# Patient Record
Sex: Male | Born: 1985 | Race: White | Hispanic: No | Marital: Single | Smoking: Never smoker
Health system: Southern US, Community
[De-identification: ages and names within clinical notes are randomized; demographics above are authoritative.]

## PROBLEM LIST (undated history)

## (undated) DIAGNOSIS — E559 Vitamin D deficiency, unspecified: Secondary | ICD-10-CM

## (undated) DIAGNOSIS — G8929 Other chronic pain: Secondary | ICD-10-CM

## (undated) DIAGNOSIS — K219 Gastro-esophageal reflux disease without esophagitis: Secondary | ICD-10-CM

## (undated) DIAGNOSIS — M549 Dorsalgia, unspecified: Secondary | ICD-10-CM

## (undated) DIAGNOSIS — G5600 Carpal tunnel syndrome, unspecified upper limb: Secondary | ICD-10-CM

## (undated) DIAGNOSIS — G4730 Sleep apnea, unspecified: Secondary | ICD-10-CM

## (undated) HISTORY — PX: OTHER SURGICAL HISTORY: SHX169

## (undated) HISTORY — DX: Carpal tunnel syndrome, unspecified upper limb: G56.00

## (undated) HISTORY — DX: Gastro-esophageal reflux disease without esophagitis: K21.9

## (undated) HISTORY — DX: Vitamin D deficiency, unspecified: E55.9

## (undated) HISTORY — DX: Sleep apnea, unspecified: G47.30

---

## 2003-08-11 ENCOUNTER — Encounter: Payer: Self-pay | Admitting: *Deleted

## 2003-08-11 ENCOUNTER — Ambulatory Visit (HOSPITAL_COMMUNITY): Admission: RE | Admit: 2003-08-11 | Discharge: 2003-08-11 | Payer: Self-pay | Admitting: *Deleted

## 2007-08-07 ENCOUNTER — Emergency Department (HOSPITAL_COMMUNITY): Admission: EM | Admit: 2007-08-07 | Discharge: 2007-08-08 | Payer: Self-pay | Admitting: Emergency Medicine

## 2009-02-06 ENCOUNTER — Emergency Department (HOSPITAL_COMMUNITY): Admission: EM | Admit: 2009-02-06 | Discharge: 2009-02-06 | Payer: Self-pay | Admitting: Emergency Medicine

## 2009-09-25 ENCOUNTER — Emergency Department (HOSPITAL_COMMUNITY): Admission: EM | Admit: 2009-09-25 | Discharge: 2009-09-25 | Payer: Self-pay | Admitting: Emergency Medicine

## 2009-12-04 ENCOUNTER — Emergency Department (HOSPITAL_COMMUNITY): Admission: EM | Admit: 2009-12-04 | Discharge: 2009-12-04 | Payer: Self-pay | Admitting: Emergency Medicine

## 2013-07-15 ENCOUNTER — Emergency Department (HOSPITAL_COMMUNITY)
Admission: EM | Admit: 2013-07-15 | Discharge: 2013-07-15 | Disposition: A | Discharge status: Home / Self Care | Payer: Self-pay | Attending: Emergency Medicine | Admitting: Emergency Medicine

## 2013-07-15 ENCOUNTER — Encounter (HOSPITAL_COMMUNITY): Payer: Self-pay | Admitting: Emergency Medicine

## 2013-07-15 ENCOUNTER — Emergency Department (HOSPITAL_COMMUNITY): Payer: Self-pay

## 2013-07-15 DIAGNOSIS — K089 Disorder of teeth and supporting structures, unspecified: Secondary | ICD-10-CM | POA: Insufficient documentation

## 2013-07-15 DIAGNOSIS — R079 Chest pain, unspecified: Secondary | ICD-10-CM

## 2013-07-15 DIAGNOSIS — R0789 Other chest pain: Secondary | ICD-10-CM | POA: Insufficient documentation

## 2013-07-15 DIAGNOSIS — Z792 Long term (current) use of antibiotics: Secondary | ICD-10-CM | POA: Insufficient documentation

## 2013-07-15 DIAGNOSIS — K0889 Other specified disorders of teeth and supporting structures: Secondary | ICD-10-CM

## 2013-07-15 LAB — POCT I-STAT TROPONIN I: Troponin i, poc: 0.01 ng/mL (ref 0.00–0.08)

## 2013-07-15 LAB — CBC
HCT: 45.2 % (ref 39.0–52.0)
Hemoglobin: 16.4 g/dL (ref 13.0–17.0)
MCHC: 36.3 g/dL — ABNORMAL HIGH (ref 30.0–36.0)
MCV: 86.9 fL (ref 78.0–100.0)
RDW: 12.9 % (ref 11.5–15.5)

## 2013-07-15 LAB — BASIC METABOLIC PANEL
GFR calc non Af Amer: >90 mL/min (ref >90)
Potassium: 3.7 mEq/L (ref 3.5–5.1)
Sodium: 137 mEq/L (ref 135–145)

## 2013-07-15 LAB — PRO B NATRIURETIC PEPTIDE: Pro B Natriuretic peptide (BNP): 6.9 pg/mL (ref 0–125)

## 2013-07-15 MED ORDER — IBUPROFEN 600 MG PO TABS: 600.0000 mg | ORAL_TABLET | Freq: Four times a day (QID) | ORAL | Status: DC | PRN

## 2013-07-15 MED ORDER — PENICILLIN V POTASSIUM 500 MG PO TABS: 500.0000 mg | ORAL_TABLET | Freq: Four times a day (QID) | ORAL | Status: AC

## 2013-07-15 NOTE — ED Provider Notes (Signed)
CSN: [id number]     Arrival date & time 07/15/13  8469 History   First MD Initiated Contact with Patient 07/15/13 0900     Chief Complaint  Patient presents with  . Chest Pain   (Consider location/radiation/quality/duration/timing/severity/associated sxs/prior Treatment) Patient is a 27 y.o. male presenting with chest pain. The history is provided by the patient. No language interpreter was used.  Chest Pain Pain location:  Substernal area Pain quality: aching and pressure   Pain radiates to:  Does not radiate Pain radiates to the back: no   Pain severity:  Moderate Onset quality:  Sudden Duration:  2 hours Timing:  Constant Progression:  Resolved Chronicity:  New Context: at rest   Context comment:  After onset of dental pain Relieved by:  Rest Worsened by:  Nothing tried Ineffective treatments:  None tried Associated symptoms: no abdominal pain, no back pain, no cough, no dizziness, no dysphagia, no fatigue, no fever, no headache, no nausea, no near-syncope, no numbness, no shortness of breath, not vomiting and no weakness   Associated symptoms comment:  Brief episode of dizziness, dental pain  Risk factors: male sex and obesity   Risk factors: no smoking     History reviewed. No pertinent past medical history. History reviewed. No pertinent past surgical history. History reviewed. No pertinent family history. History  Substance Use Topics  . Smoking status: Never Smoker   . Smokeless tobacco: Never Used  . Alcohol Use: 1.2 oz/week    2 Shots of liquor per week    Review of Systems  Constitutional: Negative for fever, activity change, appetite change and fatigue.  HENT: Positive for dental problem. Negative for congestion, facial swelling, rhinorrhea and trouble swallowing.   Eyes: Negative for photophobia and pain.  Respiratory: Negative for cough, chest tightness and shortness of breath.   Cardiovascular: Positive for chest pain. Negative for leg swelling and  near-syncope.  Gastrointestinal: Negative for nausea, vomiting, abdominal pain, diarrhea and constipation.  Endocrine: Negative for polydipsia and polyuria.  Genitourinary: Negative for dysuria, urgency, decreased urine volume and difficulty urinating.  Musculoskeletal: Negative for back pain and gait problem.  Skin: Negative for color change, rash and wound.  Allergic/Immunologic: Negative for immunocompromised state.  Neurological: Negative for dizziness, facial asymmetry, speech difficulty, weakness, numbness and headaches.  Psychiatric/Behavioral: Negative for confusion, decreased concentration and agitation.    Allergies  Review of patient's allergies indicates no known allergies.  Home Medications   Current Outpatient Rx  Name  Route  Sig  Dispense  Refill  . acetaminophen (TYLENOL) 500 MG tablet   Oral   Take 1,000 mg by mouth every 6 (six) hours as needed for pain.         Marland Kitchen ibuprofen (ADVIL,MOTRIN) 600 MG tablet   Oral   Take 1 tablet (600 mg total) by mouth every 6 (six) hours as needed for pain.   30 tablet   0   . penicillin v potassium (VEETID) 500 MG tablet   Oral   Take 1 tablet (500 mg total) by mouth 4 (four) times daily.   28 tablet   0    BP 101/72  Pulse 96  Temp(Src) 97.7 F (36.5 C) (Oral)  Resp 15  SpO2 99% Physical Exam  Constitutional: He is oriented to person, place, and time. He appears well-developed and well-nourished. No distress.  HENT:  Head: Normocephalic and atraumatic.  Mouth/Throat: Abnormal dentition. Dental caries present. No oropharyngeal exudate.  Very poor dentition w/ multiple eroded teeth  Eyes: Pupils are equal, round, and reactive to light.  Neck: Normal range of motion. Neck supple.  Cardiovascular: Normal rate, regular rhythm and normal heart sounds.  Exam reveals no gallop and no friction rub.   No murmur heard. Pulmonary/Chest: Effort normal and breath sounds normal. No respiratory distress. He has no wheezes. He  has no rales.  Abdominal: Soft. Bowel sounds are normal. He exhibits no distension and no mass. There is no tenderness. There is no rebound and no guarding.  Musculoskeletal: Normal range of motion. He exhibits no edema and no tenderness.  Neurological: He is alert and oriented to person, place, and time.  Skin: Skin is warm and dry.  Psychiatric: He has a normal mood and affect.    ED Course  Procedures (including critical care time) Labs Review Labs Reviewed  BASIC METABOLIC PANEL - Abnormal; Notable for the following:    Glucose, Bld 112 (*)    All other components within normal limits  CBC - Abnormal; Notable for the following:    MCHC 36.3 (*)    All other components within normal limits  PRO B NATRIURETIC PEPTIDE  POCT I-STAT TROPONIN I   Imaging Review Dg Chest Port 1 View  07/15/2013   *RADIOLOGY REPORT*  Clinical Data: Chest pain  PORTABLE CHEST - 1 VIEW  Comparison: December 04, 2009.  Findings: Cardiomediastinal silhouette appears normal.  No acute pulmonary disease is noted.  Bony thorax is intact.  IMPRESSION: No acute cardiopulmonary abnormality seen.   Original Report Authenticated By: Lupita Raider.,  M.D.    Date: 07/15/2013  Rate: 96  Rhythm: normal sinus rhythm  QRS Axis: normal  Intervals: normal  ST/T Wave abnormalities: normal  Conduction Disutrbances: none  Narrative Interpretation: unremarkable      MDM   1. Pain, dental   2. Chest pain    Pt is a 27 y.o. male with Pmhx as above who presents with onset of dental pain about 7am, then 2 hrs of central chest tightness form around 7:30-9:30 while at rest.  He reports then having a brief dizzy episode where he felt SOB.  Currently pt reports feeling "weak" in his chest.  No fever, chills, cough, ab pain.  Cardiopulm exam benign.  Pt has very poor dentition.  EKG nml, CP triage w/u ordered including CXR which was unremarkable, neg trop.  PERC negative, low risk for MACE by HEART score.  I believe pt safe  for outpt f/u.  Will start pen vk for likely dental infection and pt will f/u with local dentist.  Return precautions given for new or worsening symptoms   1. Pain, dental   2. Chest pain         Shanna Cisco, MD 07/15/13 [phone]

## 2013-07-15 NOTE — ED Notes (Signed)
Patient said he was at work and started having a tooth ache, then it turned into chest pain.  Patient said he took some aspirin from a med kit at work.  Patient still had chest pain and when his relief came to work he came to [facility] ED to be evaluated.

## 2013-07-15 NOTE — ED Notes (Signed)
Pt brought to room, pt undressed, in gown, on monitor; EKG perfromed

## 2013-07-15 NOTE — ED Notes (Signed)
Patient is alert and orientedx4.  Patient was explained discharge instructions and they understood them with no questions.   

## 2013-07-15 NOTE — ED Notes (Signed)
Patient is resting comfortably. 

## 2013-09-17 ENCOUNTER — Encounter (HOSPITAL_COMMUNITY): Payer: Self-pay | Admitting: Emergency Medicine

## 2013-09-17 ENCOUNTER — Emergency Department (HOSPITAL_COMMUNITY)
Admission: EM | Admit: 2013-09-17 | Discharge: 2013-09-17 | Disposition: A | Discharge status: Home / Self Care | Payer: Self-pay | Attending: Emergency Medicine | Admitting: Emergency Medicine

## 2013-09-17 DIAGNOSIS — Z7982 Long term (current) use of aspirin: Secondary | ICD-10-CM | POA: Insufficient documentation

## 2013-09-17 DIAGNOSIS — K0889 Other specified disorders of teeth and supporting structures: Secondary | ICD-10-CM

## 2013-09-17 DIAGNOSIS — K089 Disorder of teeth and supporting structures, unspecified: Secondary | ICD-10-CM | POA: Insufficient documentation

## 2013-09-17 MED ORDER — HYDROCODONE-ACETAMINOPHEN 5-325 MG PO TABS: 1.0000 | ORAL_TABLET | Freq: Four times a day (QID) | ORAL | Status: DC | PRN

## 2013-09-17 MED ORDER — PENICILLIN V POTASSIUM 500 MG PO TABS: 500.0000 mg | ORAL_TABLET | Freq: Four times a day (QID) | ORAL | Status: AC

## 2013-09-17 NOTE — ED Provider Notes (Signed)
CSN: [id number]     Arrival date & time 09/17/13  1509 History  This chart was scribed for non-physician practitioner, Santiago Glad, PA-C working with [name], MD by Greggory Stallion, ED scribe. This patient was seen in room WTR8/WTR8 and the patient's care was started at 3:58 PM.   Chief Complaint  Patient presents with  . Dental Pain   Patient is a 27 y.o. male presenting with tooth pain. The history is provided by the patient. No language interpreter was used.  Dental Pain Associated symptoms: no difficulty swallowing, no facial swelling, no fever, no gum swelling, no neck pain, no neck swelling and no trismus    HPI Comments: Grant Allen is a 27 y.o. male who presents to the Emergency Department complaining of gradual onset, gradually worsening right lower dental pain with associated swelling that started last night around 8:30 PM. He has taken aspirin and ibuprofen with no relief. Pt states cold liquids help relief some pain. He states he just got a new dentist but has not been yet. Pt denies fever, chills.   No past medical history on file. No past surgical history on file. No family history on file. History  Substance Use Topics  . Smoking status: Never Smoker   . Smokeless tobacco: Never Used  . Alcohol Use: 1.2 oz/week    2 Shots of liquor per week    Review of Systems  Constitutional: Negative for fever and chills.  HENT: Positive for dental problem. Negative for facial swelling.   Musculoskeletal: Negative for neck pain.  All other systems reviewed and are negative.    Allergies  Review of patient's allergies indicates no known allergies.  Home Medications   Current Outpatient Rx  Name  Route  Sig  Dispense  Refill  . acetaminophen (TYLENOL) 500 MG tablet   Oral   Take 1,000 mg by mouth every 6 (six) hours as needed for pain.         Marland Kitchen aspirin 325 MG tablet   Oral   Take 325 mg by mouth daily.         Marland Kitchen ibuprofen (ADVIL,MOTRIN) 600 MG tablet    Oral   Take 1 tablet (600 mg total) by mouth every 6 (six) hours as needed for pain.   30 tablet   0   . penicillin v potassium (VEETID) 500 MG tablet   Oral   Take 500 mg by mouth once.          BP 153/73  Pulse 92  Temp(Src) 97.4 F (36.3 C) (Oral)  Resp 19  SpO2 100%  Physical Exam  Nursing note and vitals reviewed. Constitutional: He is oriented to person, place, and time. He appears well-developed and well-nourished. No distress.  HENT:  Head: Normocephalic and atraumatic.  Right Ear: Tympanic membrane and ear canal normal.  Left Ear: Tympanic membrane and ear canal normal.  Mouth/Throat: Uvula is midline, oropharynx is clear and moist and mucous membranes are normal. No trismus in the jaw. Abnormal dentition. No dental abscesses or uvula swelling. No oropharyngeal exudate, posterior oropharyngeal edema, posterior oropharyngeal erythema or tonsillar abscesses.  Poor dental hygiene. Pt able to open and close mouth with out difficulty. Airway intact. Uvula midline. Mild gingival swelling with tenderness over affected area, but no fluctuance. No swelling or tenderness of submental and submandibular regions.  No tongue elevation.  No sublingual tenderness to palpation.   Eyes: Conjunctivae and EOM are normal. Pupils are equal, round, and reactive to light.  Neck: Normal range of motion and full passive range of motion without pain. Neck supple.  Cardiovascular: Normal rate, regular rhythm and normal heart sounds.   Pulmonary/Chest: Effort normal and breath sounds normal.  Musculoskeletal: Normal range of motion.  Lymphadenopathy:       Head (right side): No submental, no submandibular, no tonsillar, no preauricular and no posterior auricular adenopathy present.       Head (left side): No submental, no submandibular, no tonsillar, no preauricular and no posterior auricular adenopathy present.    He has no cervical adenopathy.  Neurological: He is alert and oriented to person,  place, and time.  Skin: Skin is warm and dry. No rash noted. He is not diaphoretic.  Psychiatric: He has a normal mood and affect. His behavior is normal.    ED Course  Procedures (including critical care time)  DIAGNOSTIC STUDIES: Oxygen Saturation is 100% on RA, normal by my interpretation.    COORDINATION OF CARE: 4:02 PM-Discussed treatment plan which includes an antibiotic and pain medication with pt at bedside and pt agreed to plan. Advised pt to follow up with a dentist.   Labs Review Labs Reviewed - No data to display Imaging Review No results found.  EKG Interpretation   None       MDM  No diagnosis found. Patient with toothache.  No gross abscess.  Exam unconcerning for Ludwig's angina or spread of infection.  Will treat with penicillin and pain medicine.  Urged patient to follow-up with dentist.    I personally performed the services described in this documentation, which was scribed in my presence. The recorded information has been reviewed and is accurate.   Santiago Glad, PA-C 09/17/13 [phone]

## 2013-09-17 NOTE — ED Notes (Signed)
Pt reports acute pain and swelling in lower r/side of mouth

## 2013-09-22 NOTE — ED Provider Notes (Signed)
Medical screening examination/treatment/procedure(s) were performed by non-physician practitioner and as supervising physician I was immediately available for consultation/collaboration.  EKG Interpretation   None        [name], MD 09/22/13 [phone]

## 2013-12-08 ENCOUNTER — Encounter (HOSPITAL_COMMUNITY): Payer: Self-pay | Admitting: Emergency Medicine

## 2013-12-08 ENCOUNTER — Emergency Department (HOSPITAL_COMMUNITY)
Admission: EM | Admit: 2013-12-08 | Discharge: 2013-12-08 | Disposition: A | Discharge status: Home / Self Care | Payer: Self-pay | Attending: Emergency Medicine | Admitting: Emergency Medicine

## 2013-12-08 DIAGNOSIS — R5383 Other fatigue: Secondary | ICD-10-CM

## 2013-12-08 DIAGNOSIS — IMO0002 Reserved for concepts with insufficient information to code with codable children: Secondary | ICD-10-CM | POA: Insufficient documentation

## 2013-12-08 DIAGNOSIS — M5416 Radiculopathy, lumbar region: Secondary | ICD-10-CM

## 2013-12-08 DIAGNOSIS — Z7982 Long term (current) use of aspirin: Secondary | ICD-10-CM | POA: Insufficient documentation

## 2013-12-08 DIAGNOSIS — R5381 Other malaise: Secondary | ICD-10-CM | POA: Insufficient documentation

## 2013-12-08 MED ORDER — HYDROCODONE-ACETAMINOPHEN 5-325 MG PO TABS: ORAL_TABLET | ORAL | Status: DC

## 2013-12-08 MED ORDER — PREDNISONE 20 MG PO TABS: ORAL_TABLET | ORAL | Status: DC

## 2013-12-08 MED ORDER — NAPROXEN 500 MG PO TABS: 500.0000 mg | ORAL_TABLET | Freq: Two times a day (BID) | ORAL | Status: DC

## 2013-12-08 MED ORDER — METHOCARBAMOL 500 MG PO TABS: 1000.0000 mg | ORAL_TABLET | Freq: Four times a day (QID) | ORAL | Status: DC

## 2013-12-08 NOTE — ED Notes (Signed)
Pt a+ox4, presents from home with c/o BLE pain, L>R, 7/10.  NKI, however pt sts "i think i must have pulled something".  Pt reports pain starts in hips and radiates into knees.  Pt also reports numbness to L lateral calf, intermittently.  Pt ambulating with steady gait, however using a cane, sts "i bought this the other day and it makes me feel better".  MAEI.  +csm.  No neuro deficits noted.  Skin pwd.  Pt is well appearing and denies other complaints.

## 2013-12-08 NOTE — ED Provider Notes (Signed)
CSN: [id number]     Arrival date & time 12/08/13  1527 History  This chart was scribed for non-physician practitioner, Rhea Bleacher, PA-C working with Richardean Canal, MD by Greggory Stallion, ED scribe. This patient was seen in room WTR7/WTR7 and the patient's care was started at 5:57 PM.    Chief Complaint  Patient presents with  . Leg Pain   The history is provided by the patient. No language interpreter was used.   HPI Comments: Grant Allen is a 28 y.o. male who presents to the Emergency Department complaining of gradual onset, constant left leg pain that started one month ago. Denies injury. He thinks he pulled something in his back before the pain started. Pt has some weakness in his left leg and states it feels heavier. He states he also has intermittent numbness to his left calf and a constant spasm that feels like a [employer]. Pt has been using a cane with some relief. He states sitting in certain positions make the pain better. Bearing weight for too long worsens the pain. Denies fever, unexpected weight loss, constipation, difficulty urinating, bowel or bladder incontinence. He states he has some history of back problems due to work. Denies history of IV drug use.   History reviewed. No pertinent past medical history. History reviewed. No pertinent past surgical history. Family History  Problem Relation Age of Onset  . Diabetes Mother   . Cancer Mother   . Cancer Brother    History  Substance Use Topics  . Smoking status: Never Smoker   . Smokeless tobacco: Never Used  . Alcohol Use: 1.2 oz/week    2 Shots of liquor per week    Review of Systems  Constitutional: Negative for fever and unexpected weight change.  Gastrointestinal: Negative for constipation.       Neg for fecal incontinence  Genitourinary: Negative for hematuria, flank pain and difficulty urinating.       Negative for bowel or bladder incontinence.   Musculoskeletal: Positive for back pain and myalgias. Negative  for arthralgias.  Neurological: Positive for weakness and numbness.       Negative for saddle paresthesias    Allergies  Review of patient's allergies indicates no known allergies.  Home Medications   Current Outpatient Rx  Name  Route  Sig  Dispense  Refill  . acetaminophen (TYLENOL) 500 MG tablet   Oral   Take 1,000 mg by mouth every 6 (six) hours as needed for pain.         Marland Kitchen aspirin 325 MG tablet   Oral   Take 325 mg by mouth daily.         Marland Kitchen HYDROcodone-acetaminophen (NORCO/VICODIN) 5-325 MG per tablet   Oral   Take 1-2 tablets by mouth every 6 (six) hours as needed for pain.   15 tablet   0   . ibuprofen (ADVIL,MOTRIN) 600 MG tablet   Oral   Take 1 tablet (600 mg total) by mouth every 6 (six) hours as needed for pain.   30 tablet   0   . penicillin v potassium (VEETID) 500 MG tablet   Oral   Take 500 mg by mouth once.          BP 159/93  Pulse 83  Temp(Src) 98.2 F (36.8 C) (Oral)  Resp 16  SpO2 99%  Physical Exam  Nursing note and vitals reviewed. Constitutional: He is oriented to person, place, and time. He appears well-developed and well-nourished. No distress.  HENT:  Head: Normocephalic and atraumatic.  Eyes: Conjunctivae and EOM are normal.  Neck: Normal range of motion. Neck supple. No tracheal deviation present.  Cardiovascular: Normal rate.   Pulmonary/Chest: Effort normal. No respiratory distress.  Abdominal: Soft. There is no tenderness. There is no CVA tenderness.  Musculoskeletal: Normal range of motion. He exhibits no tenderness.  No step-off noted with palpation of spine.   Neurological: He is alert and oriented to person, place, and time. He has normal reflexes. A sensory deficit is present. He exhibits normal muscle tone.  5/5 strength in entire lower extremities bilaterally. Decreased sensation over forefoot and lateral lower calf. Patient ambulates well but appears to have slight L foot drop when walking.   Skin: Skin is warm and  dry.  Psychiatric: He has a normal mood and affect. His behavior is normal.    ED Course  Procedures (including critical care time)  DIAGNOSTIC STUDIES: Oxygen Saturation is 99% on RA, normal by my interpretation.    COORDINATION OF CARE: 6:04 PM-Discussed treatment plan which includes pain medication, a muscle relaxer and a steroid with pt at bedside and pt agreed to plan. Advised pt to follow up with a PCP or neurosurgeon.   Labs Review Labs Reviewed - No data to display Imaging Review No results found.  EKG Interpretation   None      Patient seen and examined.   Vital signs reviewed and are as follows: Filed Vitals:   12/08/13 1837  BP:   Pulse: 88  Temp:   Resp: 18  BP 159/93  Pulse 88  Temp(Src) 98.2 F (36.8 C) (Oral)  Resp 18  SpO2 99%  No red flag s/s of low back pain. Patient was counseled on back pain precautions and told to do activity as tolerated but do not lift, push, or pull heavy objects more than 10 pounds for the next week.  Patient counseled to use ice or heat on back for no longer than 15 minutes every hour.   Patient prescribed muscle relaxer and counseled on proper use of muscle relaxant medication.    Patient prescribed narcotic pain medicine and counseled on proper use of narcotic pain medications. Counseled not to combine this medication with others containing tylenol.   Urged patient not to drink alcohol, drive, or perform any other activities that requires focus while taking either of these medications.  Patient urged to follow-up with PCP/neurosurger referral. Urged to return with worsening severe pain, loss of bowel or bladder control, trouble walking.   The patient verbalizes understanding and agrees with the plan.   MDM   1. Lumbar radiculopathy    Patient with back pain. Symptoms strongly suspicious for lumbar radiculopathy. No concerning neurological deficits, question mild foot drop? Patient is ambulatory. No warning symptoms  of back pain including: loss of bowel or bladder control, night sweats, waking from sleep with back pain, unexplained fevers or weight loss, h/o cancer, IVDU, recent trauma. No concern for cauda equina, epidural abscess, or other serious cause of back pain. Conservative measures such as rest, ice/heat and pain medicine indicated with PCP follow-up if no improvement with conservative management.   I personally performed the services described in this documentation, which was scribed in my presence. The recorded information has been reviewed and is accurate.   Renne CriglerJoshua Jimmie Rueter, PA-C 12/08/13 1859

## 2013-12-08 NOTE — ED Provider Notes (Signed)
Medical screening examination/treatment/procedure(s) were performed by non-physician practitioner and as supervising physician I was immediately available for consultation/collaboration.  EKG Interpretation   None         Jahmal Dunavant H Renard Caperton, MD 12/08/13 2356 

## 2013-12-08 NOTE — Discharge Instructions (Signed)
Please read and follow all provided instructions.  Your diagnoses today include:  1. Lumbar radiculopathy     Tests performed today include:  Vital signs - see below for your results today  Medications prescribed:   Vicodin (hydrocodone/acetaminophen) - narcotic pain medication  DO NOT drive or perform any activities that require you to be awake and alert because this medicine can make you drowsy. BE VERY CAREFUL not to take multiple medicines containing Tylenol (also called acetaminophen). Doing so can lead to an overdose which can damage your liver and cause liver failure and possibly death.   Robaxin (methocarbamol) - muscle relaxer medication  DO NOT drive or perform any activities that require you to be awake and alert because this medicine can make you drowsy.    Naproxen - anti-inflammatory pain medication  Do not exceed 500mg  naproxen every 12 hours, take with food  You have been prescribed an anti-inflammatory medication or NSAID. Take with food. Take smallest effective dose for the shortest duration needed for your pain. Stop taking if you experience stomach pain or vomiting.    Prednisone - steroid medicine   It is best to take this medication in the morning to prevent sleeping problems. If you are diabetic, monitor your blood sugar closely and stop taking Prednisone if blood sugar is over 300. Take with food to prevent stomach upset.   Take any prescribed medications only as directed.  Home care instructions:   Follow any educational materials contained in this packet  Please rest, use ice or heat on your back for the next several days  Do not lift, push, pull anything more than 10 pounds for the next week  Follow-up instructions: Please follow-up with your primary care provider in the next 1 week for further evaluation of your symptoms. If you do not have a primary care doctor -- see below for referral information.   Return instructions:  SEEK IMMEDIATE  MEDICAL ATTENTION IF YOU HAVE:  New numbness, tingling, weakness, or problem with the use of your arms or legs  Severe back pain not relieved with medications  Loss control of your bowels or bladder  Increasing pain in any areas of the body (such as chest or abdominal pain)  Shortness of breath, dizziness, or fainting.   Worsening nausea (feeling sick to your stomach), vomiting, fever, or sweats  Any other emergent concerns regarding your health   Additional Information:  Your vital signs today were: BP 159/93   Pulse 83   Temp(Src) 98.2 F (36.8 C) (Oral)   Resp 16   SpO2 99% If your blood pressure (BP) was elevated above 135/85 this visit, please have this repeated by your doctor within one month. --------------  Emergency Department Resource Guide 1) Find a Doctor and Pay Out of Pocket Although you won't have to find out who is covered by your insurance plan, it is a good idea to ask around and get recommendations. You will then need to call the office and see if the doctor you have chosen will accept you as a new patient and what types of options they offer for patients who are self-pay. Some doctors offer discounts or will set up payment plans for their patients who do not have insurance, but you will need to ask so you aren't surprised when you get to your appointment.  2) Contact Your Local Health Department Not all health departments have doctors that can see patients for sick visits, but many do, so it is worth a  call to see if yours does. If you don't know where your local health department is, you can check in your phone book. The CDC also has a tool to help you locate your state's health department, and many state websites also have listings of all of their local health departments.  3) Find a Walk-in Clinic If your illness is not likely to be very severe or complicated, you may want to try a walk in clinic. These are popping up all over the country in pharmacies,  drugstores, and shopping centers. They're usually staffed by nurse practitioners or physician assistants that have been trained to treat common illnesses and complaints. They're usually fairly quick and inexpensive. However, if you have serious medical issues or chronic medical problems, these are probably not your best option.  No Primary Care Doctor: - Call Health Connect at  [phone] - they can help you locate a primary care doctor that  accepts your insurance, provides certain services, etc. - Physician Referral Service- [phone]  Chronic Pain Problems: Organization         Address  Phone   Notes  [facility]  [phone] Patients need to be referred by their primary care doctor.   Medication Assistance: Organization         Address  Phone   Notes  [facility] Medication [facility] [address] [place] [place], [place] [address] [phone] --Must be a resident of [facility] -- Must have NO insurance coverage whatsoever (no Medicaid/ Medicare, etc.) -- The pt. MUST have a primary care doctor that directs their care regularly and follows them in the community   [facility]ist  [phone]   [employer]  [phone]    Agencies that provide inexpensive medical care: Organization         Address  Phone   Notes  [name]  [phone]   [name]    [phone]   [facility] [address] [place], [place] [address] [phone]   [facility] [address] [place] [place] [phone]   Planned Parenthood    [phone]   [facility]    [phone]   Community Health and [facility]  [address], [place] Phone:  [phone], Fax:  [phone] Hours of Operation:  9 am - 6 pm, M-F.  Also accepts Medicaid/Medicare and self-pay.  [facility]  [address], [place] Phone:  [phone], Fax: [phone]. Hours of Operation:  8:30 am - 5:30 pm, M-F.  Also accepts Medicaid and self-pay.  [facility] [place] [address], [place] Phone: [phone]   [facility] [address] Natasha Bence [place], [place] [phone], Ext. [phone] Mondays & Thursdays: 7-9 AM.  First 15 patients are seen on a first come, first serve basis.    Medicaid-accepting Community Hospital Providers:  Organization         Address  Phone   Notes  [facility] [address], [place] [phone] Also accepts self-pay patients.  [facility] [address] Laurell Josephs [place], [place]  [phone]   [facility] [address], [place] [phone]   [facility] [address], [place] [phone]   Renaye Rakers [address] [place],  [place]   [phone] Only accepts [place] [employer] patients after they have their name applied to their card.   Self-Pay (no insurance) in [facility]:  Organization         Address  Phone   Notes  Sickle Cell Patients, [facility] [address] [place], [place] [phone]   [facility] [address] [place], [place] [phone]   [name] [place]  [address], [place] [phone]   [facility]/Dr. Osei-Bonsu  [address] [place] or [address], [place] [phone] Phone number for both [place] and [place] locations is the same.  [facility] [address] [place] [phone]   [facility] [address] [place] or [address] [phone]   [facility] [address] [place] [phone], phone; [phone], fax Sees patients 1st and 3rd Saturday of every month.  Must not qualify for public  or private insurance (i.e. Medicaid, Medicare, [place] Health Choice, Veterans' Benefits)  Household income should be no more than 200% of the poverty level The clinic cannot treat you if you are pregnant or think you are pregnant  Sexually transmitted diseases are not treated at the clinic.    Dental Care: Organization         Address  Phone  Notes  [facility] Department of [facility] [address] [place], [place] [phone] Accepts children up to age 81 who are enrolled in [place] or [place] Health Choice; pregnant women with a Medicaid card; and children who have applied for Medicaid or [place] Health Choice, but were declined, whose parents can pay a reduced fee at time of service.  [facility]  [address], [place] [phone] Accepts children up to age 50 who are enrolled in [place] or [place] Health Choice; pregnant women with a Medicaid card; and children who have applied for Medicaid or [place] Health Choice, but were declined, whose parents can pay a reduced fee at time of service.  [facility]  [address] [place], [place] [phone] Patients are seen by appointment only. Walk-ins are not accepted. [facility] will see patients 15 years of age and older. Monday - Tuesday (8am-5pm) Most Wednesdays (8:30-5pm) $30 per visit, cash only  [facility]  [address], [facility] [phone] Patients are seen by appointment only. Walk-ins are not accepted. [facility] will see patients 32 years of age and older. One Wednesday Evening (Monthly: Volunteer Based).  $30 per visit, cash only  [employer]  [phone] for adults; Children under age 68, call Graduate Pediatric Dentistry at [phone]. Children aged 46-14, please call [phone] to request a pediatric application.  Dental services are provided in all areas of  dental care including fillings, crowns and bridges, complete and partial dentures, implants, gum treatment, root canals, and extractions. Preventive care is also provided. Treatment is provided to both adults and children. Patients are selected via a lottery and there is often a waiting list.   [facility] [address] [place]  [phone] [email or web address]   [facility] [address] [place], [place] [phone], Ext. [phone] Second and Fourth Thursday of each month,  opens at 6:30 AM; Clinic ends at 9 AM.  Patients are seen on a first-come first-served basis, and a limited number are seen during each clinic.   [facility]  [address], [place], [place] [phone]   Eligibility Requirements You must have lived in [place], [place], or [place] counties for at least the last three months.   You cannot be eligible for state or federal sponsored National Cityhealthcare insurance, including CIG[employer]eterans Administration, [place]icaid, or [employer]icare.   You generally cannot be eligible for healthcare insurance through your employer.    How to apply: Eligibility screenings are held every Tuesday and Wednesday afternoon from 1:00 pm until 4:00 pm. You do not need an appointment for the interview!  [facility] [address], [place], [place] [phone]   [facility]  [phone]   [facility]  [phone]   [facility]  [phone]    Behavioral Health Resources in the Community: Intensive Outpatient Programs Organization         Address  Phone  Notes  [facility] [address], [place], [place] [phone]   [facility] [address], [place], [place] [phone]   [facility] [address], [place], [place]  [phone]   [facility] [address],  [place], [place] [phone] or  [phone]   Substance Abuse Resources Organization         Address  Phone  Notes  [facility]  [phone]   [facility]  [phone]   [facility]  [phone]   [facility]  [phone]   Residential & Outpatient Substance Abuse Program  [phone]   Psychological Services Organization         Address  Phone  Notes  [facility]  [phone]- [phone]   [facility]  [phone]- [phone]   [facility] [address], [place] [phone] or [phone]    Mobile Crisis Teams Organization         Address  Phone  Notes  [facility], Mobile Crisis Care Unit  [phone]   [facility]  [address] [place], [place] [phone]   Doristine LocksSharon DeEsch [address] [place] [phone]    Self-Help/Support Groups Organization         Address  Phone             Notes  [facility] [place] - variety of support groups  [phone] Call for more information  Narcotics Anonymous (NA), [facility] [address], [place]  2 meetings at this location   Statisticianesidential Treatment Programs Organization         Address  Phone  Notes  [facility] [address],    [place]  [phone]   [facility]  [address] [place] [address], [place] [phone], [place], [place] [phone]   [facility] [address], [place] [phone] Admissions: 8am-3pm M-F  [facility] [address]    [place], [place] [phone]   [facility] [address], [place], [place] [phone]   [facility] [address]  [place], [place] [phone]   [facility] - Intensive Outpatient [address], [place], [place] [phone]   [facility] ([facility] [address]  [place], [place] [phone]  or [phone]   [facility]  [facility] Services ([facility]) [address] [place], [place] [phone] Accepts Medicaid  [facility] [address],  [place] [phone] Substance Abuse/Addiction Treatment   [facility] Organization         Address  Phone  Notes  [facility]  [phone]   Angie Fava, PhD [address] Ervin Knack [place], [place]   [phone] or [phone]   [facility]   [address] [place], [place] [phone]   [facility] [address] [place], [place] [phone] Insurance/Medicaid/sponsorship through [facility] and Families [address]                                    [place], [place] [phone] Therapy/tele-psych/case  [facility] [address], [place] [phone]    Dr. Lolly Mustache  [phone]   [facility]  [employer] [facility] Dept. 1) [address] [place] 2) [address] [place] 3)  [address], [place] [phone]  [phone]   [facility] Child Abuse Hotline [phone] or 7256504903 (After Hours)

## 2014-08-15 ENCOUNTER — Emergency Department (HOSPITAL_COMMUNITY)
Admission: EM | Admit: 2014-08-15 | Discharge: 2014-08-15 | Disposition: A | Discharge status: Home / Self Care | Payer: Self-pay | Attending: Emergency Medicine | Admitting: Emergency Medicine

## 2014-08-15 ENCOUNTER — Encounter (HOSPITAL_COMMUNITY): Payer: Self-pay | Admitting: Emergency Medicine

## 2014-08-15 ENCOUNTER — Emergency Department (HOSPITAL_COMMUNITY): Payer: Self-pay

## 2014-08-15 DIAGNOSIS — G8929 Other chronic pain: Secondary | ICD-10-CM | POA: Insufficient documentation

## 2014-08-15 DIAGNOSIS — R059 Cough, unspecified: Secondary | ICD-10-CM | POA: Insufficient documentation

## 2014-08-15 DIAGNOSIS — R05 Cough: Secondary | ICD-10-CM

## 2014-08-15 DIAGNOSIS — IMO0001 Reserved for inherently not codable concepts without codable children: Secondary | ICD-10-CM | POA: Insufficient documentation

## 2014-08-15 DIAGNOSIS — R079 Chest pain, unspecified: Secondary | ICD-10-CM | POA: Insufficient documentation

## 2014-08-15 HISTORY — DX: Other chronic pain: G89.29

## 2014-08-15 HISTORY — DX: Dorsalgia, unspecified: M54.9

## 2014-08-15 LAB — COMPREHENSIVE METABOLIC PANEL
ALT: 47 U/L (ref 0–53)
AST: 29 U/L (ref 0–37)
Albumin: 4.2 g/dL (ref 3.5–5.2)
Alkaline Phosphatase: 64 U/L (ref 39–117)
Anion gap: 12 (ref 5–15)
BILIRUBIN TOTAL: 0.5 mg/dL (ref 0.3–1.2)
BUN: 10 mg/dL (ref 6–23)
CO2: 26 mEq/L (ref 19–32)
Calcium: 9.1 mg/dL (ref 8.4–10.5)
Chloride: 102 mEq/L (ref 96–112)
Creatinine, Ser: 0.67 mg/dL (ref 0.50–1.35)
GLUCOSE: 146 mg/dL — AB (ref 70–99)
POTASSIUM: 4.1 meq/L (ref 3.7–5.3)
Sodium: 140 mEq/L (ref 137–147)
Total Protein: 7.1 g/dL (ref 6.0–8.3)

## 2014-08-15 LAB — CBC WITH DIFFERENTIAL/PLATELET
BASOS ABS: 0.1 10*3/uL (ref 0.0–0.1)
BASOS PCT: 1 % (ref 0–1)
Eosinophils Absolute: 0.1 10*3/uL (ref 0.0–0.7)
Eosinophils Relative: 1 % (ref 0–5)
HCT: 43.6 % (ref 39.0–52.0)
Hemoglobin: 15.2 g/dL (ref 13.0–17.0)
LYMPHS PCT: 22 % (ref 12–46)
Lymphs Abs: 2 10*3/uL (ref 0.7–4.0)
MCH: 30.3 pg (ref 26.0–34.0)
MCHC: 34.9 g/dL (ref 30.0–36.0)
MCV: 87 fL (ref 78.0–100.0)
Monocytes Absolute: 0.7 10*3/uL (ref 0.1–1.0)
Monocytes Relative: 8 % (ref 3–12)
NEUTROS PCT: 68 % (ref 43–77)
Neutro Abs: 6.2 10*3/uL (ref 1.7–7.7)
PLATELETS: 244 10*3/uL (ref 150–400)
RBC: 5.01 MIL/uL (ref 4.22–5.81)
RDW: 12.7 % (ref 11.5–15.5)
WBC: 9.1 10*3/uL (ref 4.0–10.5)

## 2014-08-15 LAB — I-STAT TROPONIN, ED: TROPONIN I, POC: 0 ng/mL (ref 0.00–0.08)

## 2014-08-15 MED ORDER — GUAIFENESIN-CODEINE 100-10 MG/5ML PO SOLN: 5.0000 mL | Freq: Three times a day (TID) | ORAL | Status: DC | PRN

## 2014-08-15 NOTE — ED Provider Notes (Signed)
CSN: [id number]     Arrival date & time 08/15/14  [id number] History   First MD Initiated Contact with Patient 08/15/14 0413     Chief Complaint  Patient presents with  . Cough    productive x@10  days     (Consider location/radiation/quality/duration/timing/severity/associated sxs/prior Treatment) HPI Comments: Patient is a 28 year old male with history of chronic back pain who presents to the emergency department for evaluation of right chest pain and cough. He reports that he has had a chest cold for approximately 10 days. He has associated cough with productive sputum. He is coughing up white mucus. Today he had an episode of chest tightness with radiation into his left arm. There is no pain, but felt "weak". This resolved after approximately 20 minutes. It was associated with some lightheadedness. He denies any diaphoresis or shortness of breath. No pleuritic pain. He denies any family history of early heart disease. He has never been a smoker. He denies any recent trips or surgeries. No leg swelling.   Patient is a 28 y.o. male presenting with cough. The history is provided by the patient. No language interpreter was used.  Cough Associated symptoms: chest pain, myalgias and rhinorrhea   Associated symptoms: no chills, no fever and no shortness of breath     Past Medical History  Diagnosis Date  . Chronic back pain    History reviewed. No pertinent past surgical history. Family History  Problem Relation Age of Onset  . Diabetes Mother   . Cancer Mother   . Cancer Brother    History  Substance Use Topics  . Smoking status: Never Smoker   . Smokeless tobacco: Never Used  . Alcohol Use: 1.2 oz/week    2 Shots of liquor per week     Comment: occ    Review of Systems  Constitutional: Negative for fever and chills.  HENT: Positive for rhinorrhea.   Respiratory: Positive for cough. Negative for shortness of breath.   Cardiovascular: Positive for chest pain. Negative for leg  swelling.  Musculoskeletal: Positive for myalgias.  All other systems reviewed and are negative.     Allergies  Review of patient's allergies indicates no known allergies.  Home Medications   Prior to Admission medications   Medication Sig Start Date End Date Taking? Authorizing Provider  Multiple Vitamin (MULTIVITAMIN WITH MINERALS) TABS tablet Take 1 tablet by mouth daily.   Yes Historical Provider, MD  OVER THE COUNTER MEDICATION Take 2 capsules by mouth daily.   Yes Historical Provider, MD   BP 149/76  Pulse 74  Temp(Src) 97.9 F (36.6 C) (Oral)  Resp 20  Ht 6\' 1"  (1.854 m)  Wt 340 lb (154.223 kg)  BMI 44.87 kg/m2  SpO2 98% Physical Exam  Nursing note and vitals reviewed. Constitutional: He is oriented to person, place, and time. He appears well-developed and well-nourished. No distress.  Morbidly obese  HENT:  Head: Normocephalic and atraumatic.  Right Ear: Tympanic membrane, external ear and ear canal normal.  Left Ear: Tympanic membrane, external ear and ear canal normal.  Nose: Nose normal.  Mouth/Throat: Uvula is midline, oropharynx is clear and moist and mucous membranes are normal.  Eyes: Conjunctivae are normal.  Neck: Normal range of motion. No tracheal deviation present.  Cardiovascular: Normal rate, regular rhythm, normal heart sounds, intact distal pulses and normal pulses.   Pulses:      Radial pulses are 2+ on the right side, and 2+ on the left side.  Posterior tibial pulses are 2+ on the right side, and 2+ on the left side.  Pulmonary/Chest: Effort normal and breath sounds normal. No stridor.  Abdominal: Soft. He exhibits no distension. There is no tenderness.  Musculoskeletal: Normal range of motion.  Neurological: He is alert and oriented to person, place, and time.  Skin: Skin is warm and dry. He is not diaphoretic.  Psychiatric: He has a normal mood and affect. His behavior is normal.    ED Course  Procedures (including critical care  time) Labs Review Labs Reviewed  COMPREHENSIVE METABOLIC PANEL - Abnormal; Notable for the following:    Glucose, Bld 146 (*)    All other components within normal limits  CBC WITH DIFFERENTIAL  Rosezena Sensor, ED    Imaging Review Dg Chest 2 View  08/15/2014   CLINICAL DATA:  Cough for 1 week.  Chest pain today.  EXAM: CHEST  2 VIEW  COMPARISON:  07/15/2013  FINDINGS: The heart size and mediastinal contours are within normal limits. Both lungs are clear. The visualized skeletal structures are unremarkable.  IMPRESSION: No active cardiopulmonary disease.   Electronically Signed   By: Burman Nieves M.D.   On: 08/15/2014 04:00     EKG Interpretation   Date/Time:  Tuesday August 15 2014 05:13:12 EDT Ventricular Rate:  82 PR Interval:  186 QRS Duration: 100 QT Interval:  371 QTC Calculation: 433 R Axis:   86 Text Interpretation:  Sinus rhythm No significant change was found  Confirmed by CAMPOS  MD, KEVIN ([id number]) on 08/15/2014 6:07:59 AM      MDM   Final diagnoses:  Cough  Chest pain, unspecified chest pain type   Pt CXR negative for acute infiltrate. Patients symptoms are consistent with URI, likely viral etiology. Discussed that antibiotics are not indicated for viral infections. Patient with chest pain. Otherwise health 28 year old male. Morbidly obese. Heart score of 1. PERC negative. Pt will be discharged with symptomatic treatment.  Verbalizes understanding and is agreeable with plan. Pt is hemodynamically stable & in NAD prior to dc. He was encouraged to follow up with PCP. Discussed reasons to return to ED immediately. Vital signs stable for discharge.Patient / Family / Caregiver informed of clinical course, understand medical decision-making process, and agree with plan.      Mora Bellman, PA-C 08/15/14 [phone]

## 2014-08-15 NOTE — ED Notes (Signed)
Patient states he was having right chest pain earlier today with left arm weakness. Patient reports a chest cold for greater than one week. Patient with cough, dry, productive with "alot" of mucous at home (yellow initially, now white). Patient states at this time his chest feels "weak".

## 2014-08-15 NOTE — Discharge Instructions (Signed)
Cough, Adult ° A cough is a reflex. It helps you clear your throat and airways. A cough can help heal your body. A cough can last 2 or 3 weeks (acute) or may last more than 8 weeks (chronic). Some common causes of a cough can include an infection, allergy, or a cold. °HOME CARE °· Only take medicine as told by your doctor. °· If given, take your medicines (antibiotics) as told. Finish them even if you start to feel better. °· Use a cold steam vaporizer or humidifier in your home. This can help loosen thick spit (secretions). °· Sleep so you are almost sitting up (semi-upright). Use pillows to do this. This helps reduce coughing. °· Rest as needed. °· Stop smoking if you smoke. °GET HELP RIGHT AWAY IF: °· You have yellowish-white fluid (pus) in your thick spit. °· Your cough gets worse. °· Your medicine does not reduce coughing, and you are losing sleep. °· You cough up blood. °· You have trouble breathing. °· Your pain gets worse and medicine does not help. °· You have a fever. °MAKE SURE YOU:  °· Understand these instructions. °· Will watch your condition. °· Will get help right away if you are not doing well or get worse. °Document Released: 07/17/2011 Document Revised: 03/20/2014 Document Reviewed: 07/17/2011 °ExitCare® Patient Information ©2015 [employer]. This information is not intended to replace advice given to you by your health care provider. Make sure you discuss any questions you have with your health care provider. ° °Emergency Department Resource Guide °1) Find a Doctor and Pay Out of Pocket °Although you won't have to find out who is covered by your insurance plan, it is a good idea to ask around and get recommendations. You will then need to call the office and see if the doctor you have chosen will accept you as a new patient and what types of options they offer for patients who are self-pay. Some doctors offer discounts or will set up payment plans for their patients who do not have insurance,  but you will need to ask so you aren't surprised when you get to your appointment. ° °2) Contact Your Local Health Department °Not all health departments have doctors that can see patients for sick visits, but many do, so it is worth a call to see if yours does. If you don't know where your local health department is, you can check in your phone book. The CDC also has a tool to help you locate your state's health department, and many state websites also have listings of all of their local health departments. ° °3) Find a Walk-in Clinic °If your illness is not likely to be very severe or complicated, you may want to try a walk in clinic. These are popping up all over the country in pharmacies, drugstores, and shopping centers. They're usually staffed by nurse practitioners or physician assistants that have been trained to treat common illnesses and complaints. They're usually fairly quick and inexpensive. However, if you have serious medical issues or chronic medical problems, these are probably not your best option. ° °No Primary Care Doctor: °- Call [facility] at  832-8000 - they can help you locate a primary care doctor that  accepts your insurance, provides certain services, etc. °- Physician Referral Service- 1-800-533-3463 ° °Chronic Pain Problems: °Organization         Address  Phone   Notes  °[facility]  (336) 297-2271 Patients need to be referred by   their primary care doctor.  ° °Medication Assistance: °Organization         Address  Phone   Notes  °[place] [facility] [address] °[place], [place] [address] (336) 641-8030 --Must be a resident of [place] °-- Must have NO insurance coverage whatsoever (no Medicaid/ Medicare, etc.) °-- The pt. MUST have a primary care doctor that directs their care regularly and follows them in the community °  °[facility]  (866) 331-1348   °[employer]  (888) 892-1162   ° °Agencies that provide inexpensive  medical care: °Organization         Address  Phone   Notes  °[facility]  (336) 832-8035   °[facility]    (336) 832-7272   °[facility] [address] °[place], [place] [address] (336) 832-4777   °[facility] [address], °[place] (336) 271-4999   °[facility]    (336) 373-0678   °[facility]    (336) 272-1050   °[facility] ° [address], [place] Phone:  (336) 832-4444, Fax:  (336) 832-4440 Hours of Operation:  9 am - 6 pm, M-F.  Also accepts Medicaid/Medicare and self-pay.  °[facility] ° [address], [place] Phone: (336) 832-3150, Fax: (336) 832-3151. Hours of Operation:  8:30 am - 5:30 pm, M-F.  Also accepts Medicaid and self-pay.  °[facility] [place] [address], [place] Phone: (336) 878-6027   °[facility] [address], [place], [place] (336)723-1848, Ext. 123 Mondays & Thursdays: 7-9 AM.  First 15 patients are seen on a first come, first serve basis. °  ° °Medicaid-accepting [place] Providers: ° °Organization         Address  Phone   Notes  °[facility] [address], [place] (336) 641-2100 Also accepts self-pay patients.  °[facility] [address], [place] ° (336) 856-9996   °[facility] [address], [place] (336) 288-8857   °[facility] [address] [place] [address], [place] (336) 299-7000   °Veita Bland [address], [place]  ° (336) 373-1557 Only accepts [place] Medicaid patients after they have their name applied to their card.  ° °Self-Pay (no insurance) in [place]: ° °Organization         Address  Phone   Notes  °Sickle Cell Patients, [facility] [address], [place] (336) 832-1970   °[facility] [address], [place] (336) 832-4400   ° [facility] ° [address], [place] (336) 992-4800   °[facility]/Dr. Osei-Bonsu ° [address] [place] [address], [place] or [address], [place] (336) 841-8500 Phone number for both [place] and [place] locations is the same.  °[facility] [address], [place] (336) 299-0000   °[facility] [address] [place] [address], [place] or [address] (336) 852-7530 °(336) 878-2260   °[facility] [address],  (336) 350-1642, phone; (336) 294-5005, fax Sees patients 1st and 3rd Saturday of every month.  Must not qualify for public or private insurance (i.e. Medicaid, Medicare, [place] Health Choice, Veterans' Benefits) • Household income should be no more than   200% of the poverty level •The clinic cannot treat you if you are pregnant or think you are pregnant • Sexually transmitted diseases are not treated at the clinic.  ° ° °Dental Care: °Organization         Address  Phone  Notes  °[facility] [facility] [address], [place] (336) 641-6152 Accepts children up to age 21 who are enrolled in Medicaid or [place] Health Choice; pregnant women with a Medicaid card; and children who have applied for Medicaid or [place] Health Choice, but were declined, whose parents can pay a reduced fee at time of service.  °[facility] [place]  [address], [place] (336) 641-7733 Accepts children up to age 21 who are enrolled in Medicaid or [place] Health Choice; pregnant women with a Medicaid card; and children who have applied for Medicaid or [place] Health Choice, but were declined, whose parents can pay a reduced fee at time of service.  °[facility] ° [address], [place] (336) 641-4533 Patients are seen by appointment only. Walk-ins are not accepted.  [facility] will see patients 18 years of age and older. °Monday - Tuesday (8am-5pm) °Most Wednesdays (8:30-5pm) °$30 per visit, cash only  °[facility] ° [address], [place] (336) 641-4533 Patients are seen by appointment only. Walk-ins are not accepted. [facility] will see patients 18 years of age and older. °One Wednesday Evening (Monthly: Volunteer Based).  $30 per visit, cash only  °[facility]  (919) 537-3737 for adults; Children under age 4, call Graduate Pediatric Dentistry at (919) [phone]. Children aged 4-14, please call (919) 537-3737 to request a pediatric application. ° Dental services are provided in all areas of dental care including fillings, crowns and bridges, complete and partial dentures, implants, gum treatment, root canals, and extractions. Preventive care is also provided. Treatment is provided to both adults and children. °Patients are selected via a lottery and there is often a waiting list. °  °[facility] [address], °[place] ° (336) 763-8833 [email or web address] °  °[facility] [address], [place], [place] (336)723-1848, Ext. 123 Second and Fourth Thursday of each month, opens at 6:30 AM; Clinic ends at 9 AM.  Patients are seen on a first-come first-served basis, and a limited number are seen during each clinic.  ° °[facility] ° [address], [place], [place] (336) 723-7904   Eligibility Requirements °You must have lived in [place], or [place] counties for at least the last three months. °  You cannot be eligible for state or federal sponsored healthcare insurance, including Veterans Administration, Medicaid, or Medicare. °  You generally cannot be eligible for healthcare insurance through your employer.  °  How to apply: °Eligibility screenings are held every Tuesday and Wednesday afternoon from 1:00 pm until 4:00 pm. You do not need an appointment for the  interview!  °[facility] [address], [place], [place] 336-631-2330   °[facility]  336-342-8273   °[facility]  336-703-3100   °[facility]  336-570-6415   ° °Behavioral Health Resources in the Community: °Intensive Outpatient Programs °Organization         Address  Phone  Notes  °[facility] [address], [place], [place] 336-878-6098   °[facility]   Outpatient [address], [place], [place] 336-832-9800   °[facility] [address], [place], [place] ° 336-882-2125   °[facility] [address],  °[place], [place] 1-800-853-5163 or 336-641-4981   °Substance Abuse Resources °Organization         Address  Phone  Notes  °[facility]  336-882-2125   °[facility]  336-784-9470   °[facility]  336-285-9073   °[facility]  336-845-3988   °Residential & Outpatient Substance Abuse Program  1-800-659-3381   °Psychological Services °Organization         Address  Phone  Notes  °[facility]  336- 832-9600   °[facility]  336- 378-7881   °[facility] [address], [place] 1-800-853-5163 or 336-641-4981   ° °Mobile Crisis Teams °Organization         Address  Phone  Notes  °[facility], Mobile Crisis Care Unit  1-877-626-1772   °Assertive °[facility] ° [address] [place], [place] 336-834-9664   °Sharon DeEsch [address] °[place] 336-554-5454   ° °Self-Help/Support Groups °Organization         Address  Phone             Notes  °[facility] - variety of support groups  336- 373-1402 Call for more information  °Narcotics Anonymous (NA), [facility] [address], °[place]  2 meetings at this location  ° °Residential Treatment Programs °Organization         Address  Phone  Notes  °[facility]  [address],    °[place]  1-866-801-8205   °[facility] ° [address], [place], [place] 704-293-8524   °[facility] [address], [place] 336-845-3988 Admissions: 8am-3pm M-F  °[facility] [address]    °[place], [place] 336-841-1104   °[facility] [address], [place], [place] 336-379-7146   °[facility] [address]  °[place], [place] 336-285-9073   °[facility] - Intensive Outpatient [address], [place], [place] 336-852-3033   °[facility] [address]  °[place], [place] 1-877-615-2722 or 336-784-9470   °[facility] [address] [place], [place] 336-227-7417 Accepts Medicaid  °[facility] [address]  °[place] 1-800-659-3381 Substance Abuse/Addiction Treatment  ° °[place] Behavioral Health Resources °Organization         Address  Phone  Notes  °[facility]  (888) 581-9988   °Julie Brannon, PhD [address] [place], [place]   (336) 349-5553 or (336) 951-0000   °[facility]   [address] °[place], [place] (336) 349-4454   °[facility] [address], [place], [place] (336) 342-8316 Insurance/Medicaid/sponsorship through [facility]  °[facility] [address]                                    [place], [place] (336) 342-8316 Therapy/tele-psych/case  °[facility] [address]  ° [place], [place] (336) 349-2233    °Dr. Arfeen  (336) 349-4544   °[facility] [place]  [employer] [place] [facility] 1) [address],  °2) [address], [place] °3)  [address], [place] (336) 349-3220 °(336) 342-7768 ° °(336) 342-8140   °[place] [facility] (336) 342-1394 or (336) 342-3537 (After Hours)    ° ° ° °

## 2014-08-15 NOTE — ED Provider Notes (Signed)
Medical screening examination/treatment/procedure(s) were performed by non-physician practitioner and as supervising physician I was immediately available for consultation/collaboration.   EKG Interpretation   Date/Time:  Tuesday August 15 2014 05:13:12 EDT Ventricular Rate:  82 PR Interval:  186 QRS Duration: 100 QT Interval:  371 QTC Calculation: 433 R Axis:   86 Text Interpretation:  Sinus rhythm No significant change was found  Confirmed by Latavious Bitter  MD, Caryn BeeKEVIN ([id number]) on 08/15/2014 6:07:59 AM        Lyanne CoKevin M Bostyn Kunkler, MD 08/15/14 [phone]

## 2014-09-29 IMAGING — CR DG CHEST 1V PORT
1 series · 1 of 1 positions shown · non-contrast
Comparison: December 04, 2009.

CLINICAL DATA: Chest pain

PORTABLE CHEST - 1 VIEW

[AP]
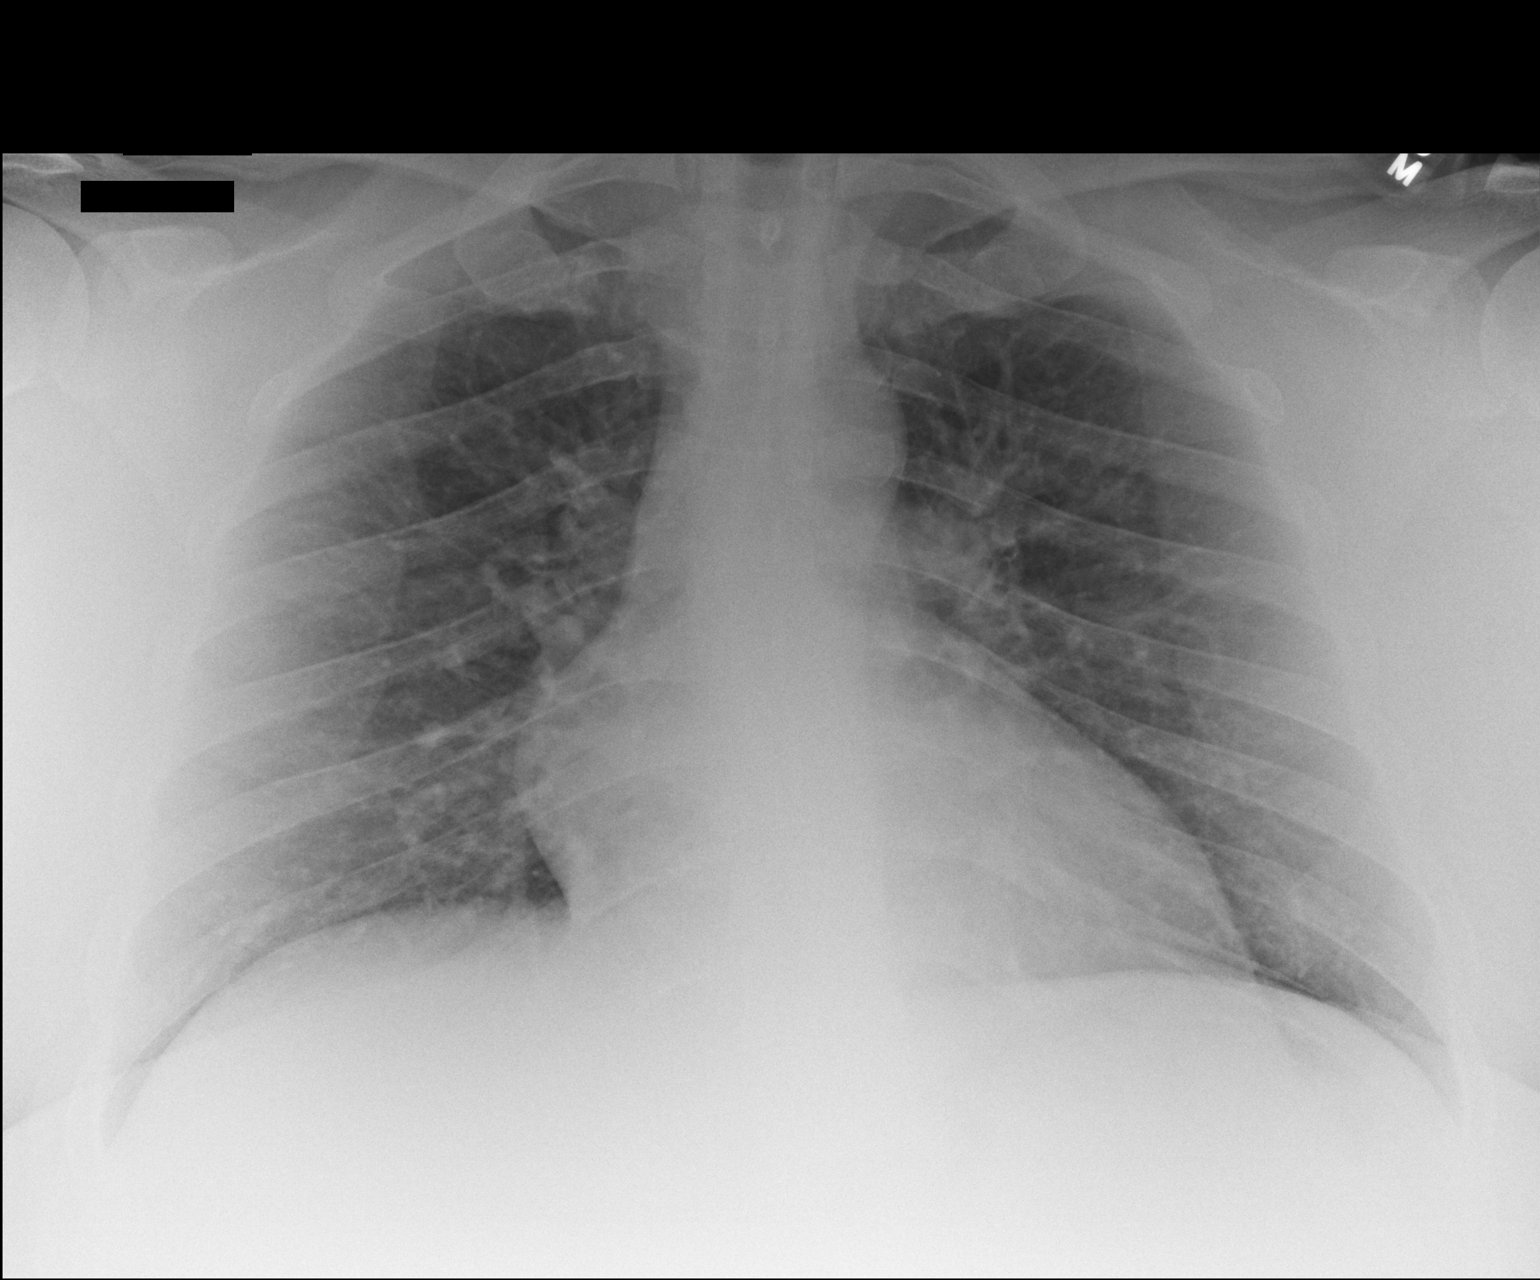

[1 of 1 positions shown; findings below may reference images not displayed]

FINDINGS: Cardiomediastinal silhouette appears normal.  No acute
pulmonary disease is noted.  Bony thorax is intact.
IMPRESSION: No acute cardiopulmonary abnormality seen.

## 2014-11-30 ENCOUNTER — Encounter (HOSPITAL_COMMUNITY): Payer: Self-pay | Admitting: *Deleted

## 2014-11-30 ENCOUNTER — Emergency Department (HOSPITAL_COMMUNITY)
Admission: EM | Admit: 2014-11-30 | Discharge: 2014-11-30 | Disposition: A | Discharge status: Home / Self Care | Payer: Self-pay | Attending: Emergency Medicine | Admitting: Emergency Medicine

## 2014-11-30 DIAGNOSIS — G8929 Other chronic pain: Secondary | ICD-10-CM | POA: Insufficient documentation

## 2014-11-30 DIAGNOSIS — K007 Teething syndrome: Secondary | ICD-10-CM | POA: Insufficient documentation

## 2014-11-30 DIAGNOSIS — K029 Dental caries, unspecified: Secondary | ICD-10-CM | POA: Insufficient documentation

## 2014-11-30 DIAGNOSIS — Z79899 Other long term (current) drug therapy: Secondary | ICD-10-CM | POA: Insufficient documentation

## 2014-11-30 MED ORDER — PENICILLIN V POTASSIUM 500 MG PO TABS: 500.0000 mg | ORAL_TABLET | Freq: Three times a day (TID) | ORAL | Status: DC

## 2014-11-30 MED ORDER — HYDROCODONE-ACETAMINOPHEN 5-325 MG PO TABS: 1.0000 | ORAL_TABLET | ORAL | Status: DC | PRN

## 2014-11-30 MED ORDER — PENICILLIN V POTASSIUM 500 MG PO TABS
500.0000 mg | ORAL_TABLET | Freq: Once | ORAL | Status: AC
  Administered 2014-11-30: 500 mg via ORAL
  Filled 2014-11-30: qty 1
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
## 2014-11-30 NOTE — ED Notes (Signed)
Pt states that he began to have rt upper gum line; pt states that he has been using cool water swishes to help with pain

## 2014-11-30 NOTE — Discharge Instructions (Signed)
Dental Care and Dentist Visits °Dental care supports good overall health. Regular dental visits can also help you avoid dental pain, bleeding, infection, and other more serious health problems in the future. It is important to keep the mouth healthy because diseases in the teeth, gums, and other oral tissues can spread to other areas of the body. Some problems, such as diabetes, heart disease, and pre-term labor have been associated with poor oral health.  °See your dentist every 6 months. If you experience emergency problems such as a toothache or broken tooth, go to the dentist right away. If you see your dentist regularly, you may catch problems early. It is easier to be treated for problems in the early stages.  °WHAT TO EXPECT AT A DENTIST VISIT  °Your dentist will look for many common oral health problems and recommend proper treatment. At your regular dental visit, you can expect: °· Gentle cleaning of the teeth and gums. This includes scraping and polishing. This helps to remove the sticky substance around the teeth and gums (plaque). Plaque forms in the mouth shortly after eating. Over time, plaque hardens on the teeth as tartar. If tartar is not removed regularly, it can cause problems. Cleaning also helps remove stains. °· Periodic X-rays. These pictures of the teeth and supporting bone will help your dentist assess the health of your teeth. °· Periodic fluoride treatments. Fluoride is a natural mineral shown to help strengthen teeth. Fluoride treatment involves applying a fluoride gel or varnish to the teeth. It is most commonly done in children. °· Examination of the mouth, tongue, jaws, teeth, and gums to look for any oral health problems, such as: °¨ Cavities (dental caries). This is decay on the tooth caused by plaque, sugar, and acid in the mouth. It is best to catch a cavity when it is small. °¨ Inflammation of the gums caused by plaque buildup (gingivitis). °¨ Problems with the mouth or malformed  or misaligned teeth. °¨ Oral cancer or other diseases of the soft tissues or jaws.  °KEEP YOUR TEETH AND GUMS HEALTHY °For healthy teeth and gums, follow these general guidelines as well as your dentist's specific advice: °· Have your teeth professionally cleaned at the dentist every 6 months. °· Brush twice daily with a fluoride toothpaste. °· Floss your teeth daily.  °· Ask your dentist if you need fluoride supplements, treatments, or fluoride toothpaste. °· Eat a healthy diet. Reduce foods and drinks with added sugar. °· Avoid smoking. °TREATMENT FOR ORAL HEALTH PROBLEMS °If you have oral health problems, treatment varies depending on the conditions present in your teeth and gums. °· Your caregiver will most likely recommend good oral hygiene at each visit. °· For cavities, gingivitis, or other oral health disease, your caregiver will perform a procedure to treat the problem. This is typically done at a separate appointment. Sometimes your caregiver will refer you to another dental specialist for specific tooth problems or for surgery. °SEEK IMMEDIATE DENTAL CARE IF: °· You have pain, bleeding, or soreness in the gum, tooth, jaw, or mouth area. °· A permanent tooth becomes loose or separated from the gum socket. °· You experience a blow or injury to the mouth or jaw area. °Document Released: 07/16/2011 Document Revised: 01/26/2012 Document Reviewed: 07/16/2011 °ExitCare® Patient Information ©2015 [employer]. This information is not intended to replace advice given to you by your health care provider. Make sure you discuss any questions you have with your health care provider. ° °Emergency Department Resource Guide °1) Find a Doctor   and Pay Out of Pocket °Although you won't have to find out who is covered by your insurance plan, it is a good idea to ask around and get recommendations. You will then need to call the office and see if the doctor you have chosen will accept you as a new patient and what types of  options they offer for patients who are self-pay. Some doctors offer discounts or will set up payment plans for their patients who do not have insurance, but you will need to ask so you aren't surprised when you get to your appointment. ° °2) Contact Your Local Health Department °Not all health departments have doctors that can see patients for sick visits, but many do, so it is worth a call to see if yours does. If you don't know where your local health department is, you can check in your phone book. The CDC also has a tool to help you locate your state's health department, and many state websites also have listings of all of their local health departments. ° °3) Find a Walk-in Clinic °If your illness is not likely to be very severe or complicated, you may want to try a walk in clinic. These are popping up all over the country in pharmacies, drugstores, and shopping centers. They're usually staffed by nurse practitioners or physician assistants that have been trained to treat common illnesses and complaints. They're usually fairly quick and inexpensive. However, if you have serious medical issues or chronic medical problems, these are probably not your best option. ° °No Primary Care Doctor: °- Call [facility] at  832-8000 - they can help you locate a primary care doctor that  accepts your insurance, provides certain services, etc. °- Physician Referral Service- 1-800-533-3463 ° °Chronic Pain Problems: °Organization         Address  Phone   Notes  °[facility]  ([phone]) [phone] Patients need to be referred by their primary care doctor.  ° °Medication Assistance: °Organization         Address  Phone   Notes  °[place] [facility] [address] °[place], [place] [address] ([phone]) [phone] --Must be a resident of [place] °-- Must have NO insurance coverage whatsoever (no Medicaid/ Medicare, etc.) °-- The pt. MUST have a primary care doctor that directs  their care regularly and follows them in the community °  °[facility]  (866) 331-1348   °[employer]  (888) 892-1162   ° °Agencies that provide inexpensive medical care: °Organization         Address  Phone   Notes  °[facility]  ([phone]) [phone]   °[facility]    (336) 832-7272   °[facility] [address] °[place], [place] [address] (336) 832-4777   °[facility] [address], °[place] (336) 271-4999   °[facility]    (336) 373-0678   °[facility]    (336) 272-1050   °[facility] ° [address], [place] Phone:  (336) 832-4444, Fax:  ([phone]) [phone] Hours of Operation:  9 am - 6 pm, M-F.  Also accepts Medicaid/Medicare and self-pay.  °[facility] ° [address], [place] Phone: ([phone]) [phone], Fax: ([phone]) [phone]. Hours of Operation:  8:30 am - 5:30 pm, M-F.  Also accepts Medicaid and self-pay.  °[facility] [place] [address], [place] Phone: ([phone]) [phone]   °  [facility] [address], [place], [place] (336)[phone], Ext. 123 Mondays & Thursdays: 7-9 AM.  First 15 patients are seen on a first come, first serve basis. °  ° °Medicaid-accepting [place] Providers: ° °Organization         Address  Phone   Notes  °[facility] [address], [place] ([phone]) [phone] Also accepts self-pay patients.  °[facility] [address], [place] ° (336) [phone]   °[facility] [address], [place] (336) [phone]   °[facility] [address] [place] [address], [place] ([phone]) [phone]   °[facility] [address], [place]  ° ([phone]) [phone] Only accepts [place] Access Medicaid patients after they have their name applied to their card.  ° °Self-Pay (no insurance) in [place]: ° °Organization          Address  Phone   Notes  °Sickle Cell Patients, [facility] [address], [place] ([phone]) [phone]   °[facility] [address], [place] (336) [phone]   °[facility] ° [address], [place] (336) [phone]   °[facility]/Dr. Osei-Bonsu ° [address] [place] [address], [place] or [address], [place] ([phone]) [phone] Phone number for both [place] and [place] locations is the same.  °[facility] [address], [place] ([phone]) [phone]   °[facility] [address] [place] [address], [place] or [address] (336) 852-7530 °(336) [phone]   °[facility] [address], [place] (336) 350-1642, phone; (336) 294-5005, fax Sees patients 1st and 3rd Saturday of every month.  Must not qualify for public or private insurance (i.e. Medicaid, Medicare, [place] Health Choice, Veterans' Benefits) • Household income should be no more than 200% of the poverty level •The clinic cannot treat you if you are pregnant or think you are pregnant • Sexually transmitted diseases are not treated at the clinic.  ° ° °Dental Care: °Organization         Address  Phone  Notes  °[facility] [address], [place] (336) [phone] Accepts children up to age 21 who are enrolled in Medicaid or [place] Health Choice; pregnant women with a Medicaid card; and children who have applied for Medicaid or [place] Health Choice, but were declined, whose parents can pay a reduced fee at time of service.  °[facility] [place]  [address], [place] (336) 641-7733 Accepts children up to age 21 who are enrolled in Medicaid or [place] Health Choice; pregnant women with a Medicaid card; and children who have applied for Medicaid or  Health Choice, but were declined, whose parents can pay a reduced fee at time of  service.  °[facility] ° [address],  (336) 641-4533 Patients are seen by appointment only. Walk-ins are not accepted. [facility] will see patients 18 years of age and older. °Monday - Tuesday (8am-5pm) °Most Wednesdays (8:30-5pm) °$30 per visit, cash only  °[facility] ° [address], [place] ([phone]) [phone] Patients are seen by appointment only. Walk-ins are not accepted. [facility] will see patients 18 years of age and older. °One Wednesday Evening (Monthly: Volunteer Based).  $30 per visit,   cash only  [facility] [facility]  ([phone] for adults; Children under age 414, call Graduate Pediatric Dentistry at [phone]. Children aged 754-14, please call [phone] to request a pediatric application.  Dental services are provided in all areas of dental care including fillings, crowns and bridges, complete and partial dentures, implants, gum treatment, root canals, and extractions. Preventive care is also provided. Treatment is provided to both adults and children. Patients are selected via a lottery and there is often a waiting list.   [facility] [address], [place]  [phone] [email or web address]   [facility] [address], [place], [place] [phone], Ext. [phone] Second and Fourth Thursday of each month, opens at 6:30 AM; Clinic ends at 9 AM.  Patients are seen on a first-come first-served basis, and a limited number are seen during each clinic.   [facility]  [address], [place], [place] [phone]   Eligibility Requirements You must have lived in [place], [place], or [place] for at least the last three months.   You cannot be eligible for state or federal sponsored [place] insurance, including [employer]eterans [employer], [place], or [employer]icare.   You generally cannot be eligible for healthcare insurance through your employer.     How to apply: Eligibility screenings are held every Tuesday and Wednesday afternoon from 1:00 pm until 4:00 pm. You do not need an appointment for the interview!  [facility] [address], [place], [place] [phone]   [facility]  [phone]   [facility]  [phone]   [facility]  [phone]

## 2014-12-23 ENCOUNTER — Emergency Department: Payer: Self-pay | Admitting: Emergency Medicine

## 2014-12-24 LAB — BASIC METABOLIC PANEL
Anion Gap: 7 (ref 7–16)
BUN: 8 mg/dL (ref 7–18)
CALCIUM: 8.6 mg/dL (ref 8.5–10.1)
CO2: 29 mmol/L (ref 21–32)
Chloride: 104 mmol/L (ref 98–107)
Creatinine: 0.91 mg/dL (ref 0.60–1.30)
EGFR (Non-African Amer.): >60
Glucose: 104 mg/dL — ABNORMAL HIGH (ref 65–99)
Osmolality: 278 (ref 275–301)
Potassium: 3 mmol/L — ABNORMAL LOW (ref 3.5–5.1)
SODIUM: 140 mmol/L (ref 136–145)

## 2014-12-24 LAB — CBC
HCT: 48.1 % (ref 40.0–52.0)
HGB: 16.4 g/dL (ref 13.0–18.0)
MCH: 30.5 pg (ref 26.0–34.0)
MCHC: 34.2 g/dL (ref 32.0–36.0)
MCV: 89 fL (ref 80–100)
Platelet: 271 10*3/uL (ref 150–440)
RBC: 5.39 10*6/uL (ref 4.40–5.90)
RDW: 13.5 % (ref 11.5–14.5)
WBC: 9.4 10*3/uL (ref 3.8–10.6)

## 2014-12-24 LAB — D-DIMER(ARMC): D-Dimer: 164 ng/ml

## 2014-12-24 LAB — PRO B NATRIURETIC PEPTIDE: B-Type Natriuretic Peptide: 7 pg/mL (ref 0–125)

## 2014-12-24 LAB — TROPONIN I

## 2015-08-10 ENCOUNTER — Emergency Department (HOSPITAL_COMMUNITY): Payer: Self-pay

## 2015-08-10 ENCOUNTER — Emergency Department (HOSPITAL_COMMUNITY)
Admission: EM | Admit: 2015-08-10 | Discharge: 2015-08-10 | Disposition: A | Discharge status: Home / Self Care | Payer: Self-pay | Attending: Emergency Medicine | Admitting: Emergency Medicine

## 2015-08-10 ENCOUNTER — Encounter (HOSPITAL_COMMUNITY): Payer: Self-pay

## 2015-08-10 DIAGNOSIS — Z79899 Other long term (current) drug therapy: Secondary | ICD-10-CM | POA: Insufficient documentation

## 2015-08-10 DIAGNOSIS — G8929 Other chronic pain: Secondary | ICD-10-CM | POA: Insufficient documentation

## 2015-08-10 DIAGNOSIS — R079 Chest pain, unspecified: Secondary | ICD-10-CM

## 2015-08-10 DIAGNOSIS — R002 Palpitations: Secondary | ICD-10-CM | POA: Insufficient documentation

## 2015-08-10 LAB — I-STAT CHEM 8, ED
BUN: 9 mg/dL (ref 6–20)
CHLORIDE: 101 mmol/L (ref 101–111)
Calcium, Ion: 1.12 mmol/L (ref 1.12–1.23)
Creatinine, Ser: 0.8 mg/dL (ref 0.61–1.24)
Glucose, Bld: 179 mg/dL — ABNORMAL HIGH (ref 65–99)
HCT: 48 % (ref 39.0–52.0)
HEMOGLOBIN: 16.3 g/dL (ref 13.0–17.0)
POTASSIUM: 3.6 mmol/L (ref 3.5–5.1)
Sodium: 140 mmol/L (ref 135–145)
TCO2: 24 mmol/L (ref 0–100)

## 2015-08-10 LAB — D-DIMER, QUANTITATIVE (NOT AT ARMC)

## 2015-08-10 LAB — CBC
HCT: 45.3 % (ref 39.0–52.0)
Hemoglobin: 16.1 g/dL (ref 13.0–17.0)
MCH: 31.4 pg (ref 26.0–34.0)
MCHC: 35.5 g/dL (ref 30.0–36.0)
MCV: 88.5 fL (ref 78.0–100.0)
PLATELETS: 260 10*3/uL (ref 150–400)
RBC: 5.12 MIL/uL (ref 4.22–5.81)
RDW: 12.8 % (ref 11.5–15.5)
WBC: 9.5 10*3/uL (ref 4.0–10.5)

## 2015-08-10 LAB — I-STAT TROPONIN, ED: TROPONIN I, POC: 0 ng/mL (ref 0.00–0.08)

## 2015-08-10 MED ORDER — LORAZEPAM 1 MG PO TABS: 0.5000 mg | ORAL_TABLET | Freq: Three times a day (TID) | ORAL | Status: DC | PRN

## 2015-08-10 NOTE — ED Provider Notes (Signed)
CSN: [id number]     Arrival date & time 08/10/15  7829 History   First MD Initiated Contact with Patient 08/10/15 0406     Chief Complaint  Patient presents with  . Chest Pain     (Consider location/radiation/quality/duration/timing/severity/associated sxs/prior Treatment) HPI Comments: 29 year old male with a history of chronic back pain presents to the emergency department for evaluation of chest pain. Patient states that chest pain began at 2300 this evening. He reports the pain was a constant, aching pain that developed into an intermittent sharp pain sensation. Pain has been intermittent and associated with palpitations characterized as a strong, bounding pulse. Patient denies feeling as though his heart was racing. Patient has had no shortness of breath, nausea, vomiting, syncope or near-syncope, extremity numbness or weakness, or leg swelling. He denies recent surgeries or hospitalizations. No recent travel. No history of DVT/PE. Patient has no personal history of dyslipidemia, hypertension, or diabetes mellitus. He states that he has been evaluated for similar chest pain in the past with a negative workup. He states that he is under a lot of stress with work and family.  The history is provided by the patient. No language interpreter was used.    Past Medical History  Diagnosis Date  . Chronic back pain    History reviewed. No pertinent past surgical history. Family History  Problem Relation Age of Onset  . Diabetes Mother   . Cancer Mother   . Cancer Brother    Social History  Substance Use Topics  . Smoking status: Never Smoker   . Smokeless tobacco: Never Used  . Alcohol Use: 1.2 oz/week    2 Shots of liquor per week     Comment: occ    Review of Systems  Constitutional: Negative for fever.  Respiratory: Negative for shortness of breath.   Cardiovascular: Positive for chest pain and palpitations.  Gastrointestinal: Negative for nausea and vomiting.  Neurological:  Negative for syncope and weakness.  All other systems reviewed and are negative.   Allergies  Review of patient's allergies indicates no known allergies.  Home Medications   Prior to Admission medications   Medication Sig Start Date End Date Taking? Authorizing Provider  aspirin 325 MG tablet Take 325 mg by mouth every 4 (four) hours as needed for moderate pain.   Yes Historical Provider, MD  ibuprofen (ADVIL,MOTRIN) 200 MG tablet Take 400 mg by mouth every 6 (six) hours as needed for moderate pain.   Yes Historical Provider, MD  Multiple Vitamin (MULTIVITAMIN WITH MINERALS) TABS tablet Take 1 tablet by mouth daily.   Yes Historical Provider, MD  guaiFENesin-codeine 100-10 MG/5ML syrup Take 5 mLs by mouth 3 (three) times daily as needed for cough. Patient not taking: Reported on 11/30/2014 08/15/14   Junious Silk, PA-C  HYDROcodone-acetaminophen (NORCO/VICODIN) 5-325 MG per tablet Take 1-2 tablets by mouth every 4 (four) hours as needed. Patient not taking: Reported on 08/10/2015 11/30/14   Elpidio Anis, PA-C  penicillin v potassium (VEETID) 500 MG tablet Take 1 tablet (500 mg total) by mouth 3 (three) times daily. Patient not taking: Reported on 08/10/2015 11/30/14   Elpidio Anis, PA-C   BP 151/105 mmHg  Pulse 96  Temp(Src) 98 F (36.7 C) (Oral)  Resp 20  Ht  (1.854 m)  Wt 340 lb (154.223 kg)  BMI 44.87 kg/m2  SpO2 98%   Physical Exam  Constitutional: He is oriented to person, place, and time. He appears well-developed and well-nourished. No distress.  Nontoxic/nonseptic appearing  HENT:  Head: Normocephalic and atraumatic.  Eyes: Conjunctivae and EOM are normal. No scleral icterus.  Neck: Normal range of motion.  Cardiovascular: Normal rate, regular rhythm and intact distal pulses.   Heart rate ranges between 88 bpm and 115 bpm.  Pulmonary/Chest: Effort normal and breath sounds normal. No respiratory distress. He has no wheezes. He has no rales.  Respirations even and  unlabored. Lungs clear to auscultation bilaterally.  Musculoskeletal: Normal range of motion.  Neurological: He is alert and oriented to person, place, and time. He exhibits normal muscle tone. Coordination normal.  Skin: Skin is warm and dry. No rash noted. He is not diaphoretic. No erythema. No pallor.  Psychiatric: He has a normal mood and affect. His behavior is normal.  Nursing note and vitals reviewed.   ED Course  Procedures (including critical care time) Labs Review Labs Reviewed  I-STAT CHEM 8, ED - Abnormal; Notable for the following:    Glucose, Bld 179 (*)    All other components within normal limits  CBC  D-DIMER, QUANTITATIVE (NOT AT [facility])  [place]a Sensor, ED    Imaging Review Dg Chest 2 View  08/10/2015   CLINICAL DATA:  Patient woke up from sleep this morning with Mid chest pain and shortness of breath. Tachycardia.  EXAM: CHEST  2 VIEW  COMPARISON:  12/23/2014  FINDINGS: The heart size and mediastinal contours are within normal limits. Both lungs are clear. The visualized skeletal structures are unremarkable.  IMPRESSION: No active cardiopulmonary disease.   Electronically Signed   By: Burman Nieves M.D.   On: 08/10/2015 04:40     I have personally reviewed and evaluated these images and lab results as part of my medical decision-making.   ED ECG REPORT   Date: 08/10/2015  Rate: 100  Rhythm: sinus tachycardia  QRS Axis: right  Intervals: normal  ST/T Wave abnormalities: normal  Conduction Disutrbances:none  Narrative Interpretation: Sinus tachycardia; no STEMI or ischemic change  Old EKG Reviewed: none available  I have personally reviewed the EKG tracing and agree with the computerized printout as noted.   MDM   Final diagnoses:  Chest pain, unspecified chest pain type    29 year old male presents to the emergency department for evaluation of chest pain. Patient has had similar symptoms in the past with negative ED workups. Patient had a  negative troponin today with a nonischemic EKG. Symptoms are atypical for ACS. Heart score is 0 c/w low risk of MACE. Also doubt aortic dissection or pulmonary embolism. D-dimer is negative. Chest x-ray shows no pneumothorax or focal consolidation. He ambulates in the emergency department without hypoxia today.  Suspect that symptoms are secondary to increased stress or undiagnosed anxiety. Will prescribe low-dose Ativan to try and alleviate symptoms. Patient also referred to cardiology for scheduling of an outpatient stress test. Return precautions discussed and provided. Patient agreeable to plan with no unaddressed concerns. Patient discharged in good condition.   Filed Vitals:   08/10/15 0356 08/10/15 0519  BP: 151/105 149/108  Pulse: 96 92  Temp: 98 F (36.7 C)   TempSrc: Oral   Resp: 20 16  Height:  (1.854 m)   Weight: 340 lb (154.223 kg)   SpO2: 98% 99%     Antony Madura, PA-C 08/10/15 0530  Gerhard Munch, MD 08/10/15 [phone]

## 2015-08-10 NOTE — Discharge Instructions (Signed)

## 2015-08-10 NOTE — ED Notes (Signed)
Pulse Ox 97% while ambulating

## 2015-08-10 NOTE — ED Notes (Signed)
Pt complains of central chest pain since about midnight, he describes it as a tingling type pain that comes and goes

## 2015-10-30 IMAGING — CR DG CHEST 2V
2 series · 2 of 2 positions shown · non-contrast
Comparison: 07/15/2013

CLINICAL DATA: Cough for 1 week.  Chest pain today.

EXAM:
CHEST  2 VIEW

[w chest pa]
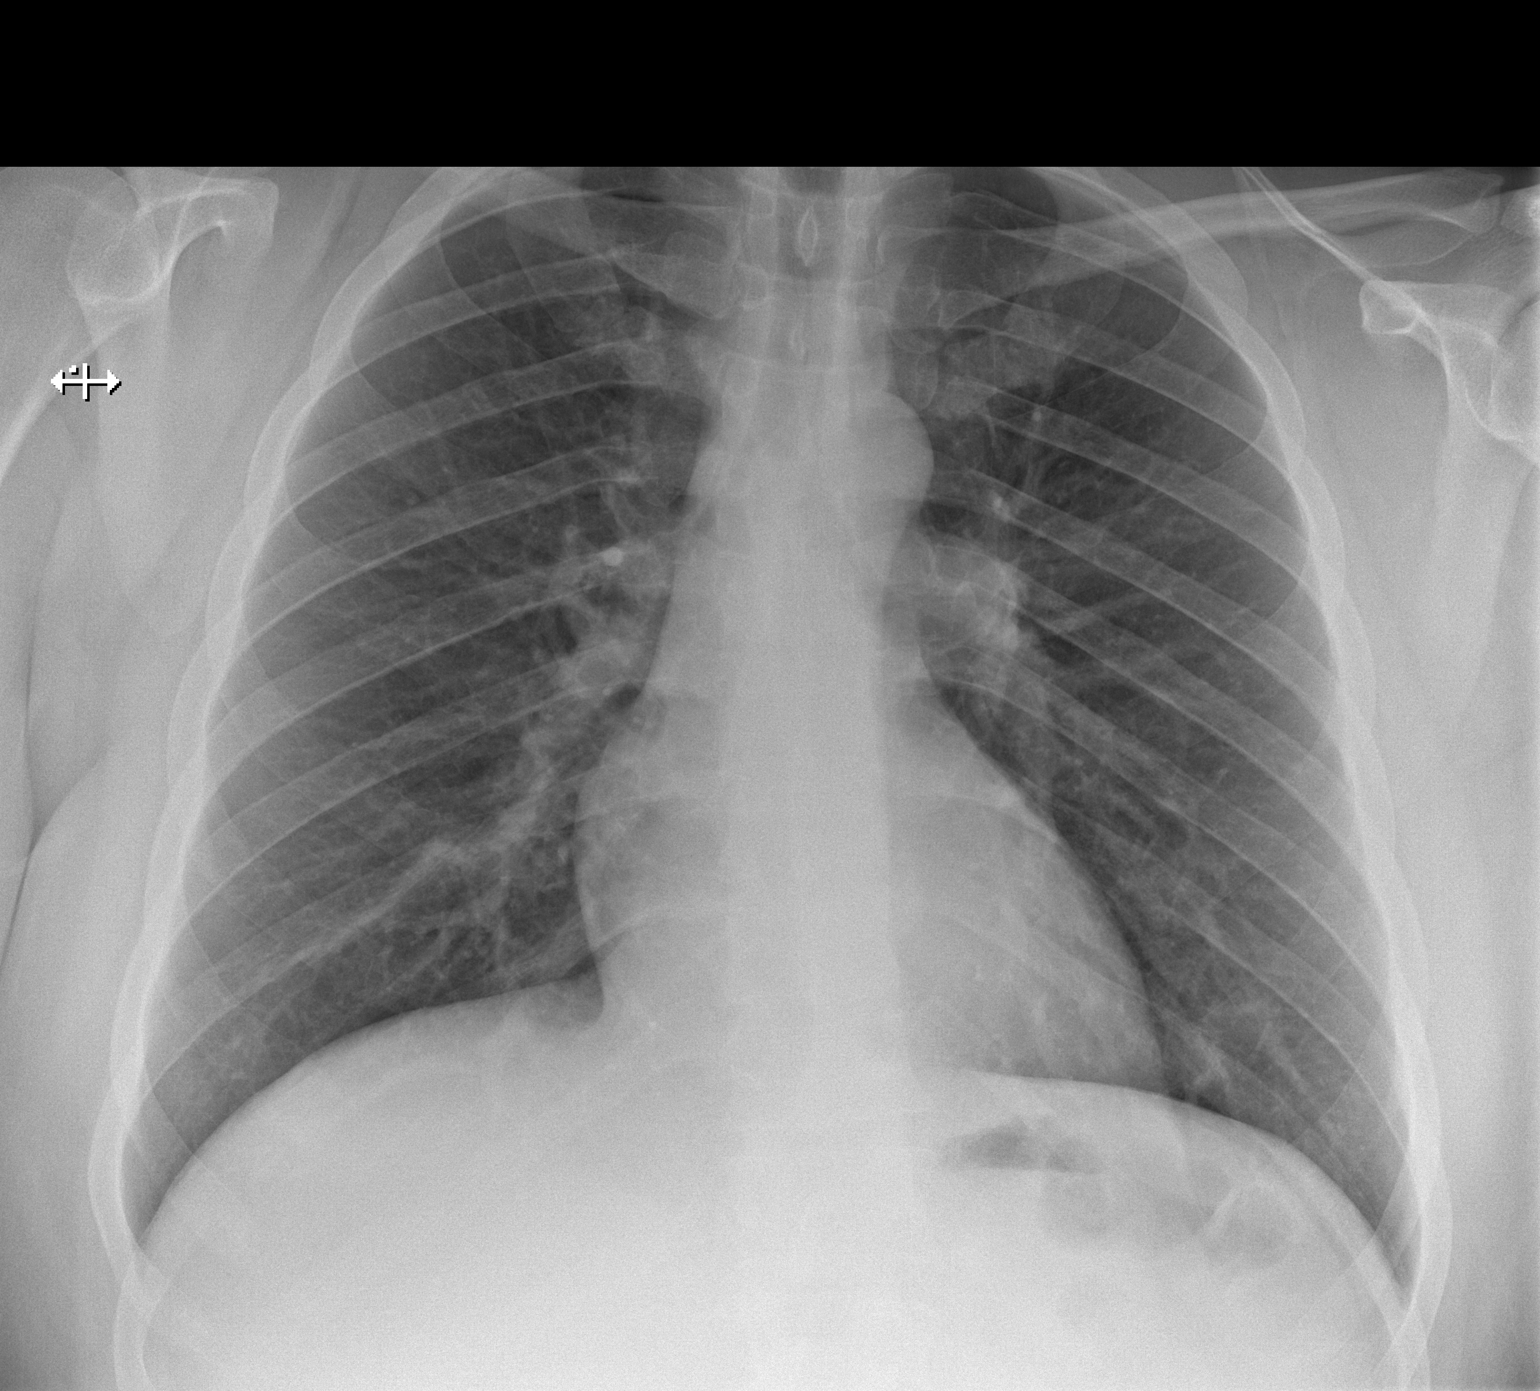

[w chest lat]
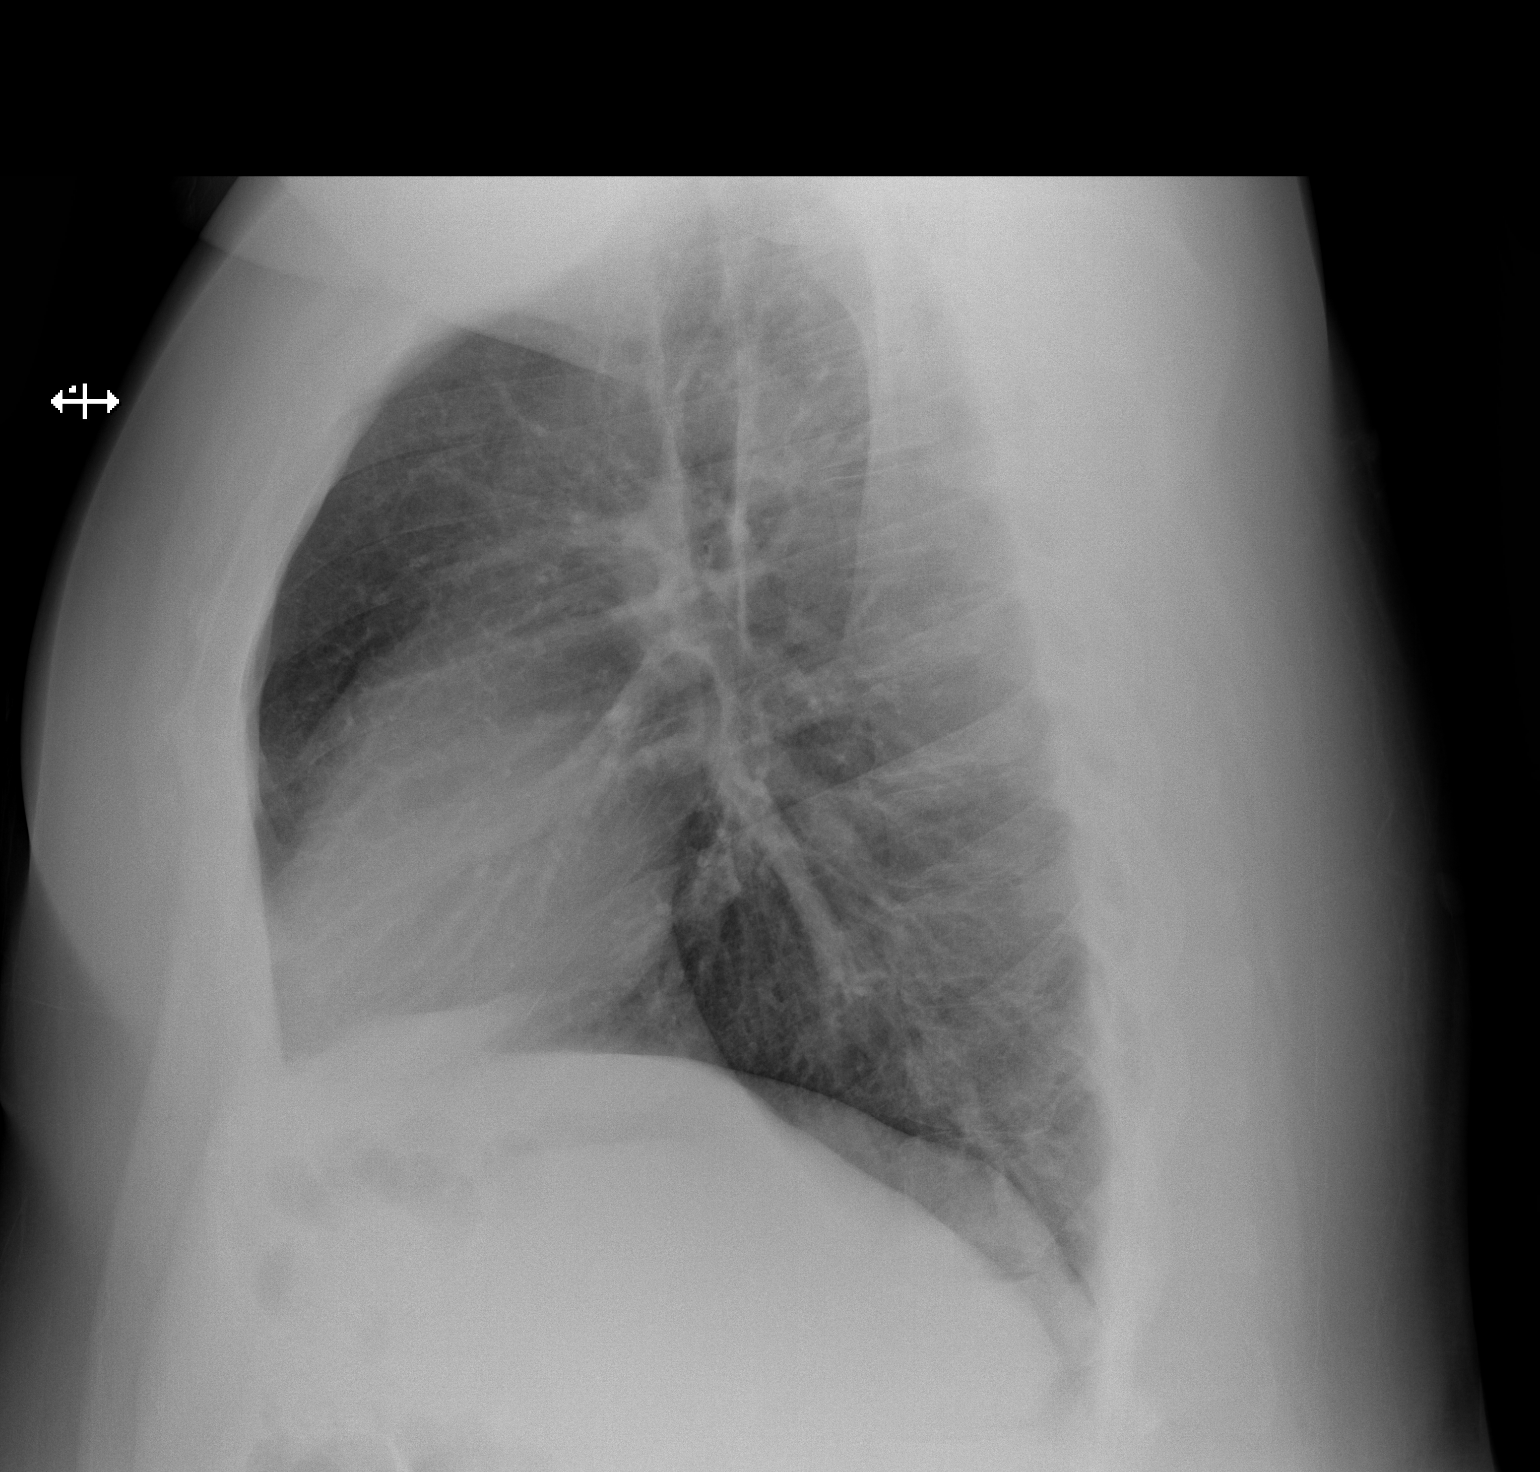

[2 of 2 positions shown; findings below may reference images not displayed]

FINDINGS: The heart size and mediastinal contours are within normal limits.
Both lungs are clear. The visualized skeletal structures are
unremarkable.
IMPRESSION: No active cardiopulmonary disease.

## 2015-11-29 ENCOUNTER — Encounter (HOSPITAL_COMMUNITY): Payer: Self-pay | Admitting: Emergency Medicine

## 2015-11-29 ENCOUNTER — Emergency Department (HOSPITAL_COMMUNITY)
Admission: EM | Admit: 2015-11-29 | Discharge: 2015-11-29 | Discharge status: Against Medical Advice | Payer: Self-pay | Attending: Emergency Medicine | Admitting: Emergency Medicine

## 2015-11-29 DIAGNOSIS — K0889 Other specified disorders of teeth and supporting structures: Secondary | ICD-10-CM | POA: Insufficient documentation

## 2015-11-29 DIAGNOSIS — G8929 Other chronic pain: Secondary | ICD-10-CM | POA: Insufficient documentation

## 2015-11-29 NOTE — ED Notes (Signed)
Pt informed registration clerk that he would be leaving

## 2015-11-29 NOTE — ED Notes (Signed)
Pt is c/o dental pain  Pt states he broke his tooth the other day and now has pain and states his gums are swollen  Pt states the tooth is on the top left

## 2016-01-02 ENCOUNTER — Ambulatory Visit (INDEPENDENT_AMBULATORY_CARE_PROVIDER_SITE_OTHER): Payer: BLUE CROSS/BLUE SHIELD | Admitting: Physician Assistant

## 2016-01-02 VITALS — BP 139/83 | HR 80 | Temp 98.6°F | Resp 16 | Ht 75.0 in | Wt 343.0 lb

## 2016-01-02 DIAGNOSIS — R220 Localized swelling, mass and lump, head: Secondary | ICD-10-CM | POA: Diagnosis not present

## 2016-01-02 MED ORDER — AMOXICILLIN-POT CLAVULANATE 875-125 MG PO TABS: 1.0000 | ORAL_TABLET | Freq: Two times a day (BID) | ORAL | Status: AC

## 2016-01-02 NOTE — Progress Notes (Signed)
[facility] [address], [place] [address] [phone]  Date:  01/02/2016   Name:  Grant Allen   DOB:  Apr 23, 1986   MRN:  [id number]  PCP:  No PCP Per Patient   Chief Complaint  Patient presents with  . Facial Swelling    pt had tooth abcess and may have turn into an infection   History of Present Illness:  Grant Allen is a 30 y.o. male patient who presents to [facility] for cc of facial pain 3 days ago.  Patient has had a hx of tooth pain, but this started 2 days ago, and dissipated at his upper left sided (pre-molar area).  The next day he noticed left facial swelling at his cheek up to his eye.  He has no eye pain or vision change.  He has felt a nodule like swelling at his cheek.  There is some tenderness. No throat pain.  No nasal congestion however has had previous congestion and cold-like symptoms weeks ago.  He has no fever, drainage, or redness.  He is not followed by a dentist at this time, however states he has some dental insurance.     There are no active problems to display for this patient.   Past Medical History  Diagnosis Date  . Chronic back pain     History reviewed. No pertinent past surgical history.  Social History  Substance Use Topics  . Smoking status: Never Smoker   . Smokeless tobacco: Never Used  . Alcohol Use: No     Comment: former    Family History  Problem Relation Age of Onset  . Diabetes Mother   . Cancer Mother   . Cancer Brother     No Known Allergies  Medication list has been reviewed and updated.  Current Outpatient Prescriptions on File Prior to Visit  Medication Sig Dispense Refill  . aspirin 325 MG tablet Take 325 mg by mouth every 4 (four) hours as needed for moderate pain. Reported on 01/02/2016    . guaiFENesin-codeine 100-10 MG/5ML syrup Take 5 mLs by mouth 3 (three) times daily as needed for cough. (Patient not taking: Reported on 11/30/2014) 120 mL 0  . HYDROcodone-acetaminophen (NORCO/VICODIN)  5-325 MG per tablet Take 1-2 tablets by mouth every 4 (four) hours as needed. (Patient not taking: Reported on 08/10/2015) 12 tablet 0  . ibuprofen (ADVIL,MOTRIN) 200 MG tablet Take 400 mg by mouth every 6 (six) hours as needed for moderate pain. Reported on 01/02/2016    . LORazepam (ATIVAN) 1 MG tablet Take 0.5 tablets (0.5 mg total) by mouth 3 (three) times daily as needed for anxiety. (Patient not taking: Reported on 01/02/2016) 5 tablet 0  . Multiple Vitamin (MULTIVITAMIN WITH MINERALS) TABS tablet Take 1 tablet by mouth daily. Reported on 01/02/2016    . penicillin v potassium (VEETID) 500 MG tablet Take 1 tablet (500 mg total) by mouth 3 (three) times daily. (Patient not taking: Reported on 08/10/2015) 30 tablet 0   No current facility-administered medications on file prior to visit.    ROS ROS otherwise unreamrkable unless listed above.   Physical Examination: BP 139/83 mmHg  Pulse 80  Temp(Src) 98.6 F (37 C) (Oral)  Resp 16  Ht  (1.905 m)  Wt 343 lb (155.584 kg)  BMI 42.87 kg/m2  SpO2 98% Ideal Body Weight: Weight in (lb) to have BMI = 25: 199.6  Physical Exam  Constitutional: He is oriented to person, place, and time.  He appears well-developed and well-nourished. No distress.  HENT:  Head: Normocephalic and atraumatic.  Right Ear: Tympanic membrane, external ear and ear canal normal.  Left Ear: Tympanic membrane, external ear and ear canal normal.  Nose: No mucosal edema or rhinorrhea. Right sinus exhibits no maxillary sinus tenderness and no frontal sinus tenderness. Left sinus exhibits maxillary sinus tenderness. Left sinus exhibits no frontal sinus tenderness.  Mouth/Throat: No oropharyngeal exudate, posterior oropharyngeal edema or posterior oropharyngeal erythema.  Very poor dentition with deteriorated teeth throughout.  Specific teeth are deteriorated to the gumline, however no erythema or drainage.  He has no tenderness, erythema, or drainage.   There is a nodule at  his left maxillary position.  Not fluctuant.     Eyes: Conjunctivae and EOM are normal. Pupils are equal, round, and reactive to light. Left conjunctiva is not injected. Right eye exhibits normal extraocular motion. Left eye exhibits normal extraocular motion.  Cardiovascular: Normal rate.   Pulmonary/Chest: Effort normal. No respiratory distress.  Neurological: He is alert and oriented to person, place, and time.  Skin: Skin is warm and dry. He is not diaphoretic.  Psychiatric: He has a normal mood and affect. His behavior is normal.     Assessment and Plan: Grant Allen is a 30 y.o. male who is here today for cc of left facial swelling.  --I have advised abx use and warm compresses.  Advised ibuprofen as well.  I have stressed the importance of him returning in 48 hours if symptoms show no improvement.  Possibility of this being an early developing abscess, and may require ENT, and imaging.   He will also follow up with a dentist as well.   Facial swelling - Plan: amoxicillin-clavulanate (AUGMENTIN) 875-125 MG tablet  Trena Platt, PA-C [facility] 2/15/20175:32 PM

## 2016-01-02 NOTE — Patient Instructions (Signed)
I would like you to take antibiotic to completion. If this does not dramatically improve within the next 48 hours, I need you to return and let us know immediately.  You may likely need to see an ear nose and throat.   You can use warm compresses in this area. Take ibuprofen for pain and swelling. If you experience eye pain, increased eye swelling, drainage, fever, nausea, or dizziness--I need you to return immediately.

## 2016-03-08 IMAGING — CR DG CHEST 1V PORT
1 series · 1 of 1 positions shown · non-contrast
Comparison: 08/15/2014

CLINICAL DATA: 1 hr history of heart palpitations and shortness of
breath.

EXAM:
PORTABLE CHEST - 1 VIEW

[dxr portable chest single view]
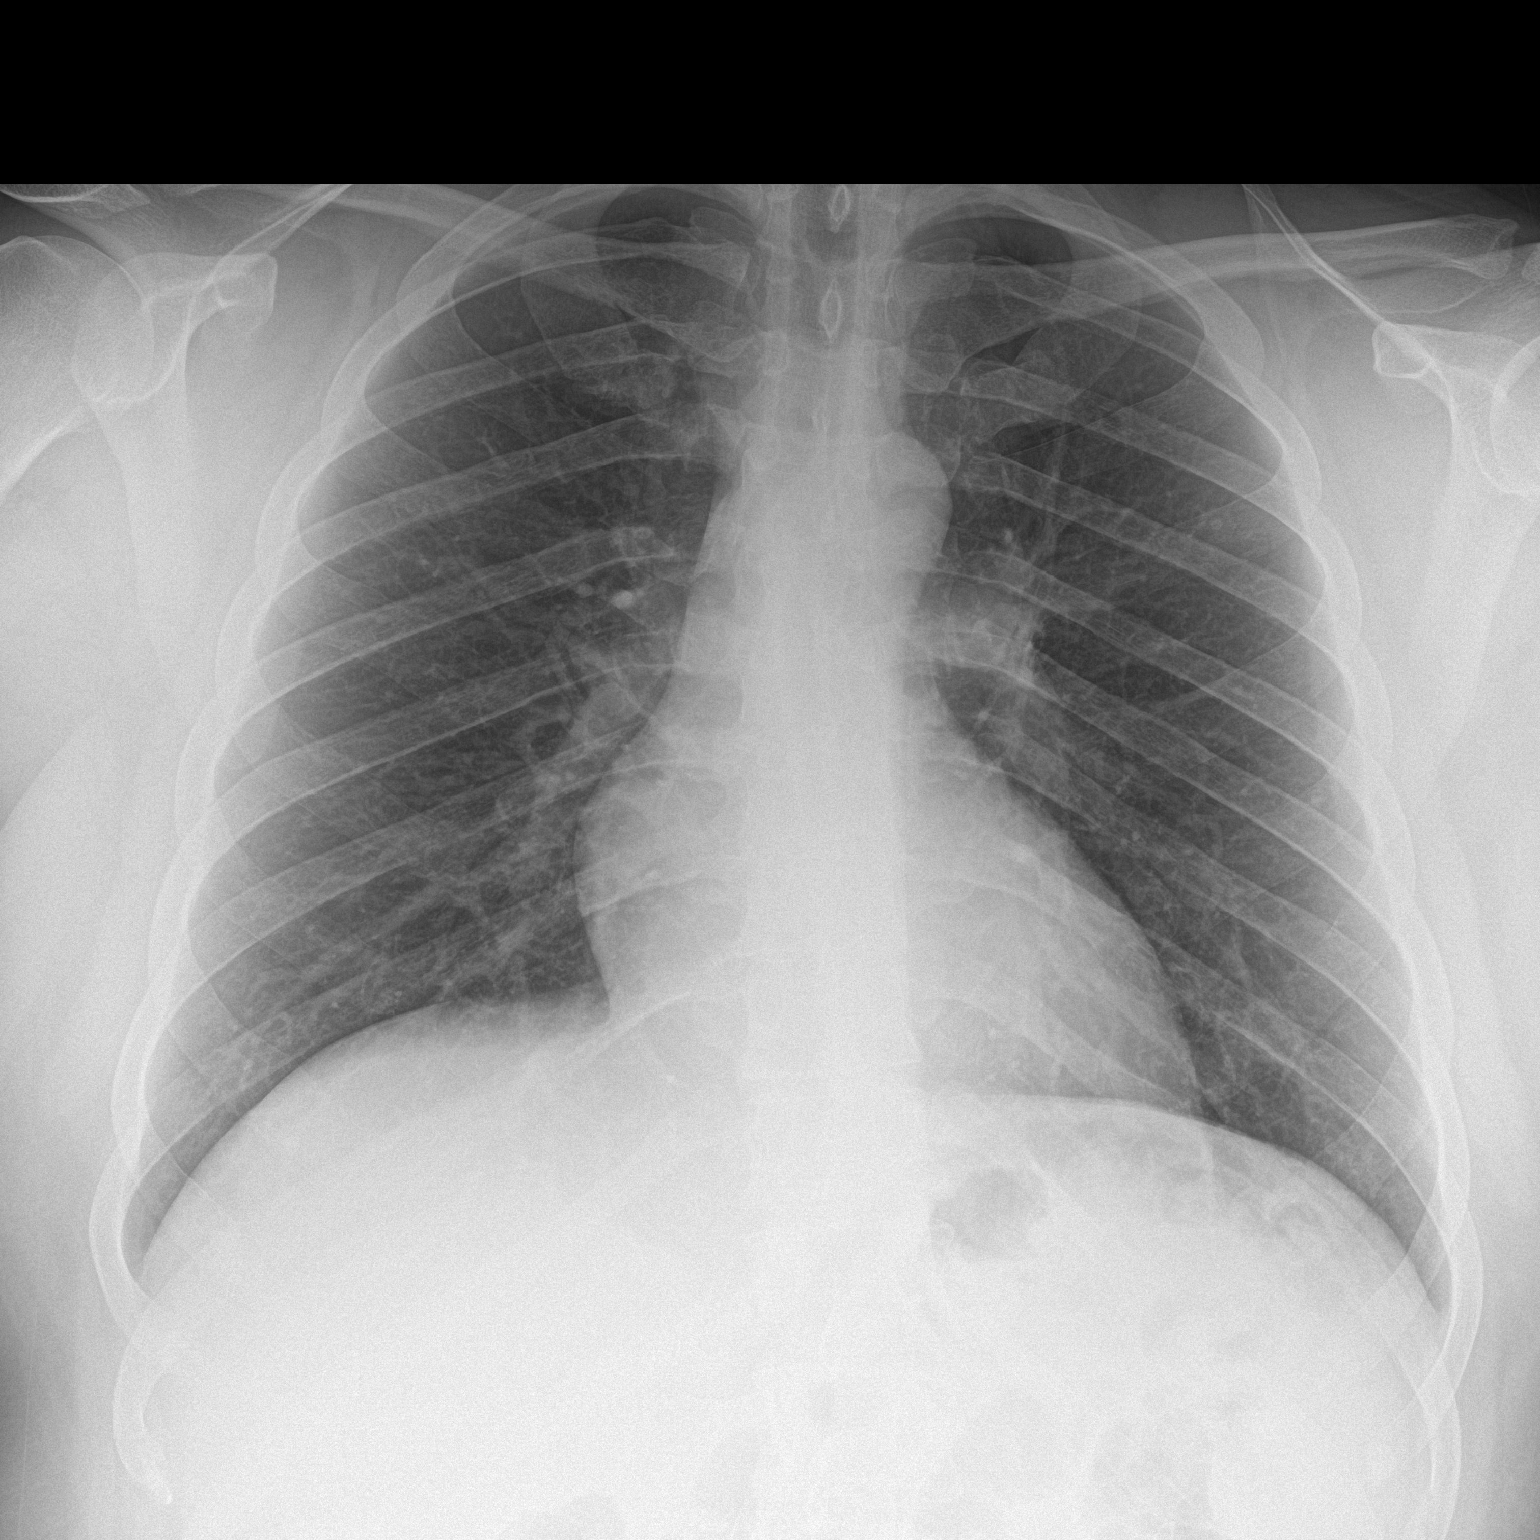

[1 of 1 positions shown; findings below may reference images not displayed]

FINDINGS: The heart size and mediastinal contours are within normal limits.
Both lungs are clear. The visualized skeletal structures are
unremarkable.
IMPRESSION: Normal chest x-ray.

## 2016-10-24 IMAGING — CR DG CHEST 2V
2 series · 2 of 2 positions shown · non-contrast
Comparison: 12/23/2014

CLINICAL DATA: Patient woke up from sleep this morning with Mid
chest pain and shortness of breath. Tachycardia.

EXAM:
CHEST  2 VIEW

[w chest pa]
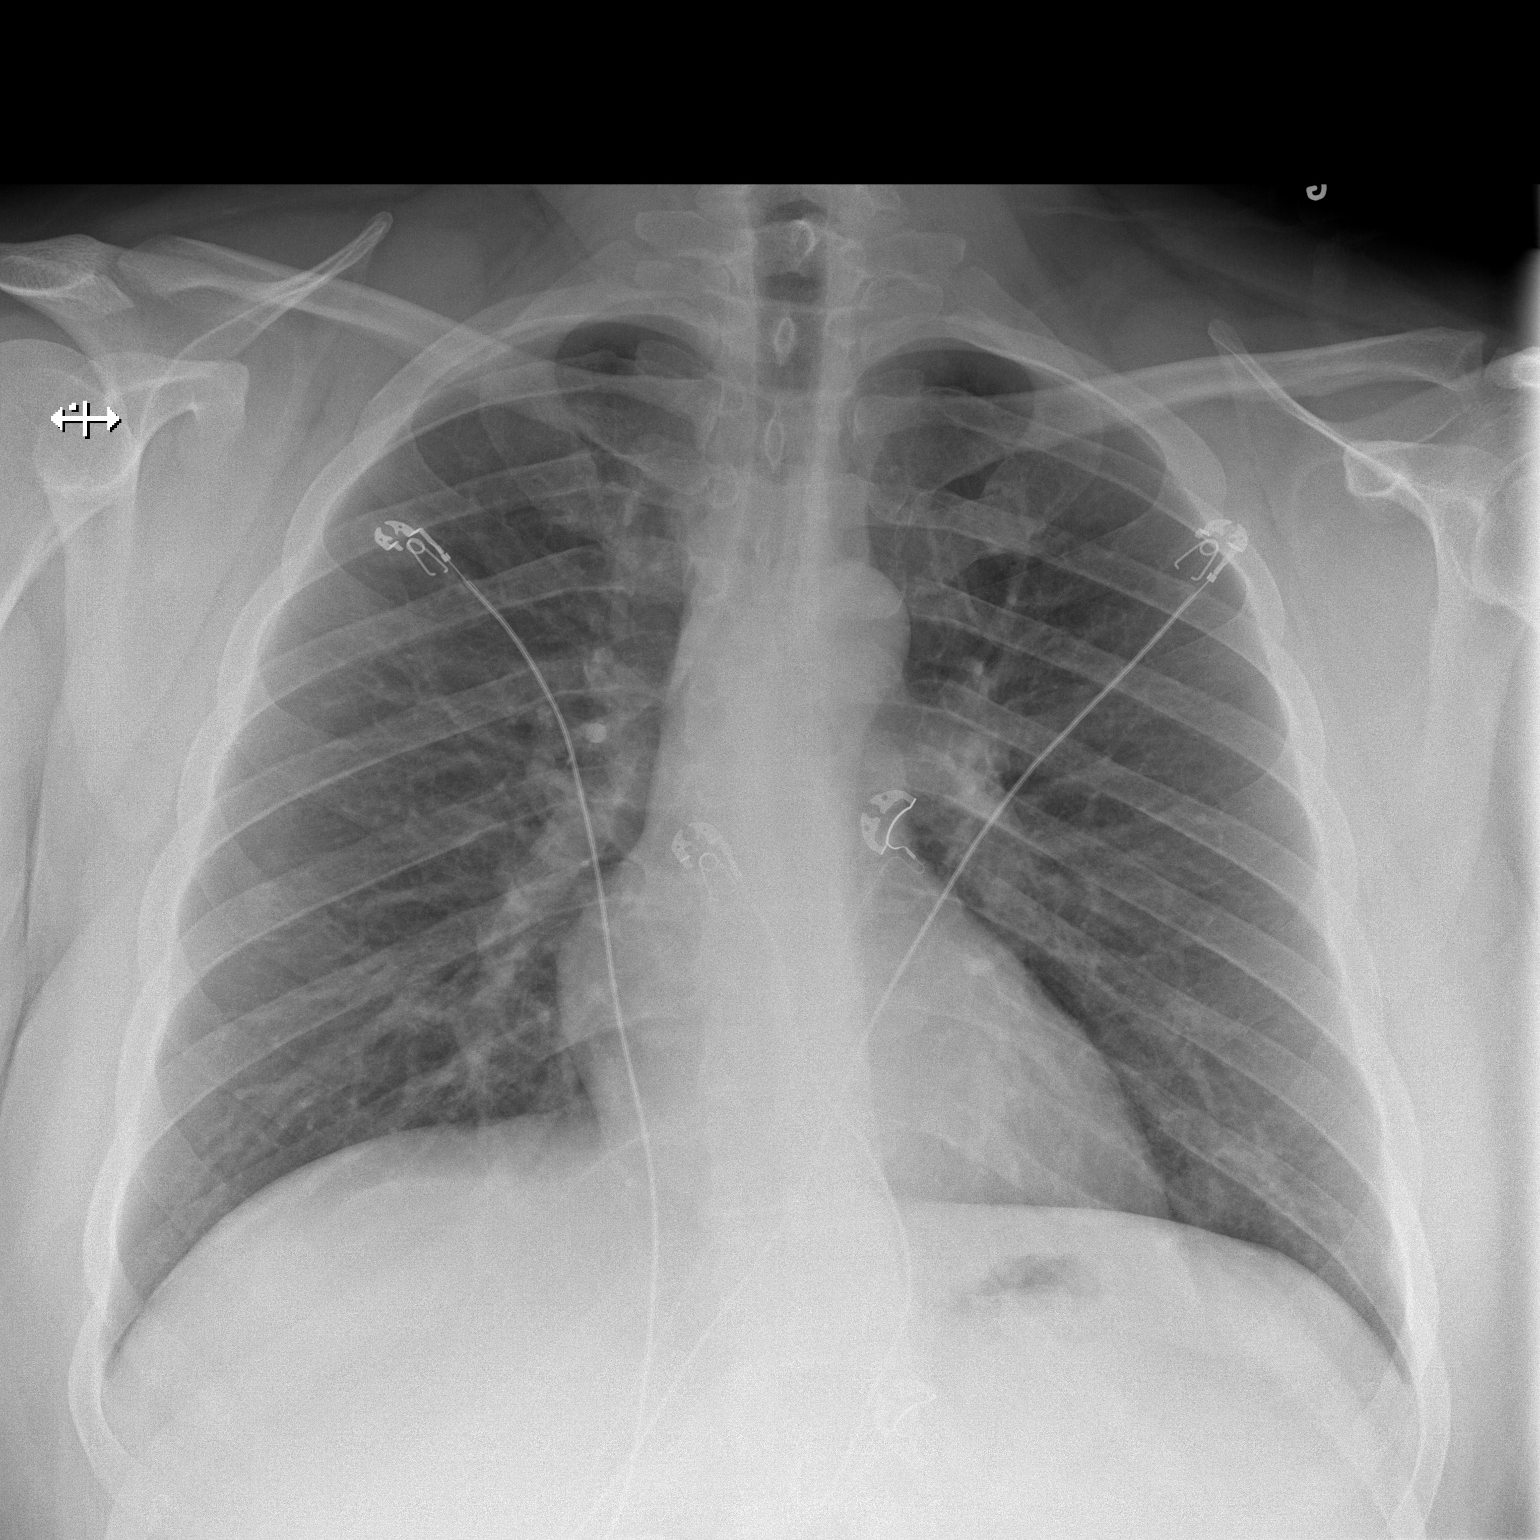

[w chest lat]
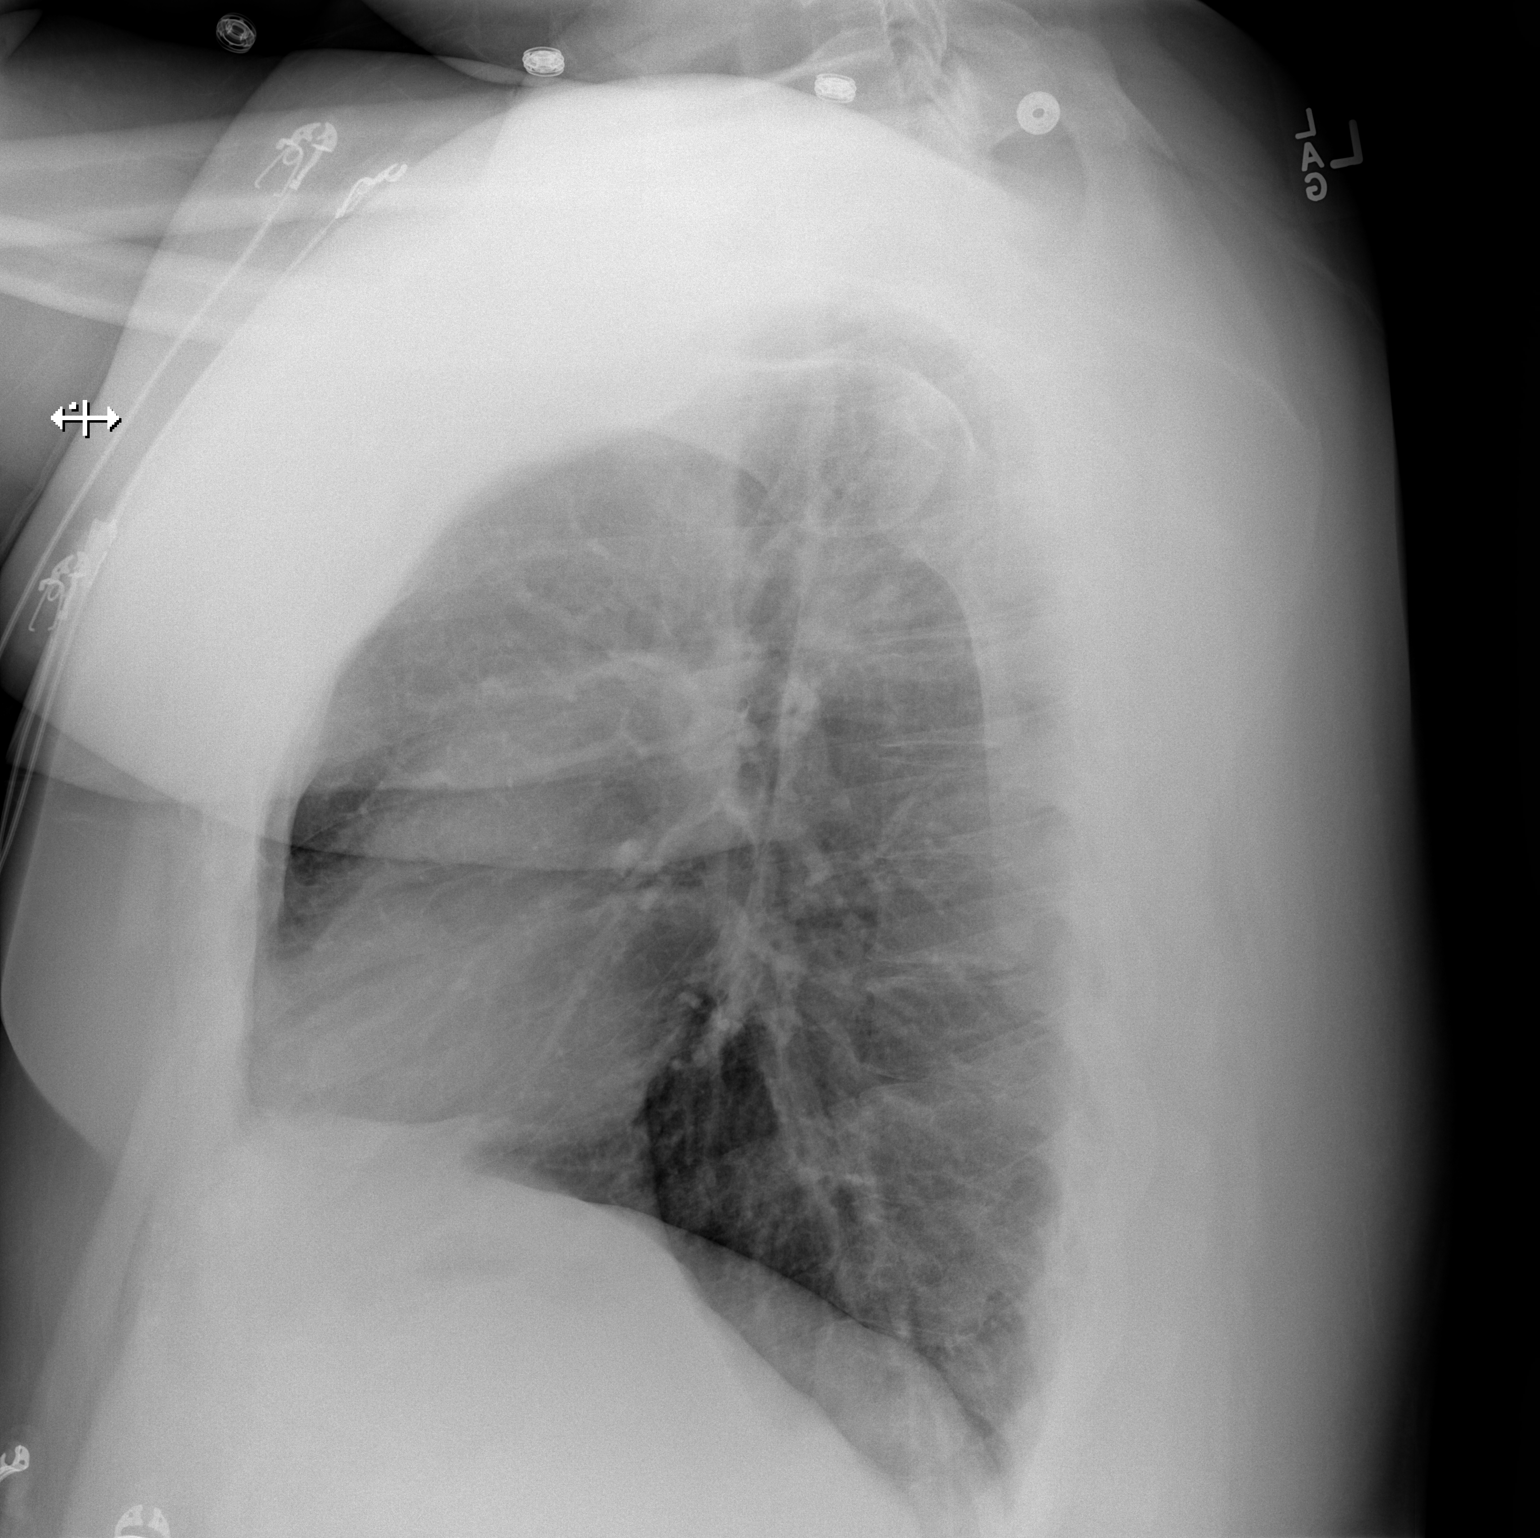

[2 of 2 positions shown; findings below may reference images not displayed]

FINDINGS: The heart size and mediastinal contours are within normal limits.
Both lungs are clear. The visualized skeletal structures are
unremarkable.
IMPRESSION: No active cardiopulmonary disease.

## 2020-01-11 DIAGNOSIS — R030 Elevated blood-pressure reading, without diagnosis of hypertension: Secondary | ICD-10-CM

## 2020-01-11 HISTORY — DX: Elevated blood-pressure reading, without diagnosis of hypertension: R03.0

## 2020-10-08 ENCOUNTER — Other Ambulatory Visit: Payer: Self-pay

## 2020-10-08 DIAGNOSIS — G8929 Other chronic pain: Secondary | ICD-10-CM | POA: Insufficient documentation

## 2020-10-10 ENCOUNTER — Ambulatory Visit (INDEPENDENT_AMBULATORY_CARE_PROVIDER_SITE_OTHER): Payer: 59 | Admitting: Cardiology

## 2020-10-10 ENCOUNTER — Other Ambulatory Visit: Payer: Self-pay

## 2020-10-10 ENCOUNTER — Encounter: Payer: Self-pay | Admitting: Cardiology

## 2020-10-10 VITALS — BP 140/102 | HR 96 | Ht 74.0 in | Wt 344.2 lb

## 2020-10-10 DIAGNOSIS — I10 Essential (primary) hypertension: Secondary | ICD-10-CM | POA: Insufficient documentation

## 2020-10-10 DIAGNOSIS — R072 Precordial pain: Secondary | ICD-10-CM | POA: Insufficient documentation

## 2020-10-10 DIAGNOSIS — R002 Palpitations: Secondary | ICD-10-CM

## 2020-10-10 HISTORY — DX: Palpitations: R00.2

## 2020-10-10 HISTORY — DX: Morbid (severe) obesity due to excess calories: E66.01

## 2020-10-10 HISTORY — DX: Essential (primary) hypertension: I10

## 2020-10-10 HISTORY — DX: Precordial pain: R07.2

## 2020-10-10 MED ORDER — DILTIAZEM HCL ER COATED BEADS 180 MG PO CP24: 180.0000 mg | ORAL_CAPSULE | Freq: Every day | ORAL | 3 refills | Status: DC

## 2020-10-10 MED ORDER — NITROGLYCERIN 0.4 MG SL SUBL: 0.4000 mg | SUBLINGUAL_TABLET | SUBLINGUAL | 11 refills | Status: DC | PRN

## 2020-10-10 NOTE — Progress Notes (Addendum)
Cardiology Office Note:    Date:  10/10/2020   ID:  Grant Allen, DOB 07-03-1986, MRN [id number]  PCP:  Jim Like, NP  Cardiologist:  Thomasene Ripple, DO  Electrophysiologist:  None   Referring MD: Jim Like, NP   " I am doing well"  History of Present Illness:    Grant Allen is a 34 y.o. male with a hx of hypertension, morbid obesity comes today to be evaluated for palpitations as well as intermittent chest pain.  He described his pain as a diffuse midsternal sensation.  He notes that it last for few minutes and then resolved.  He had been prior sent to get a stress test but due to significant elevated blood pressure this testing was canceled the patient was a started on blood pressure medication by his PCP and now has been asked to see cardiology. He does have associated shortness of breath.  At times he has some radiation to his shoulders but not to his arms.  The patient also tells me at times he has intermittent palpitations.  He described as an abrupt onset of fast heartbeat last for few minutes and resolves.  This is also associated with his chest pain.  I was able to review the notes from the patient's PCP office as well as his lab work.  Past Medical History:  Diagnosis Date  . Chronic back pain   . Elevated BP without diagnosis of hypertension 01/11/2020    Past Surgical History:  Procedure Laterality Date  . no past surgical history      Current Medications: No outpatient medications have been marked as taking for the 10/10/20 encounter (Office Visit) with Thomasene Ripple, DO.     Allergies:   Patient has no known allergies.   Social History   Socioeconomic History  . Marital status: Single    Spouse name: Not on file  . Number of children: Not on file  . Years of education: Not on file  . Highest education level: Not on file  Occupational History  . Not on file  Tobacco Use  . Smoking status: Never Smoker  . Smokeless tobacco: Never Used  Substance and  Sexual Activity  . Alcohol use: No    Comment: former  . Drug use: No  . Sexual activity: Yes    Birth control/protection: None  Other Topics Concern  . Not on file  Social History Narrative  . Not on file   Social Determinants of Health   Financial Resource Strain:   . Difficulty of Paying Living Expenses: Not on file  Food Insecurity:   . Worried About Programme researcher, broadcasting/film/video in the Last Year: Not on file  . Ran Out of Food in the Last Year: Not on file  Transportation Needs:   . Lack of Transportation (Medical): Not on file  . Lack of Transportation (Non-Medical): Not on file  Physical Activity:   . Days of Exercise per Week: Not on file  . Minutes of Exercise per Session: Not on file  Stress:   . Feeling of Stress : Not on file  Social Connections:   . Frequency of Communication with Friends and Family: Not on file  . Frequency of Social Gatherings with Friends and Family: Not on file  . Attends Religious Services: Not on file  . Active Member of Clubs or Organizations: Not on file  . Attends Banker Meetings: Not on file  . Marital Status: Not on file  Family History: The patient's family history includes Brain cancer in his mother; Diabetes in his mother; Hypertension in his father; Lymphoma in his brother; Thyroid disease in his father.  ROS:   Review of Systems  Constitution: Negative for decreased appetite, fever and weight gain.  HENT: Negative for congestion, ear discharge, hoarse voice and sore throat.   Eyes: Negative for discharge, redness, vision loss in right eye and visual halos.  Cardiovascular: Reports chest pain, shortness of breath and intermittent palpitations.  Negative for leg swelling orthopnea.  Respiratory: Negative for cough, hemoptysis, shortness of breath and snoring.   Endocrine: Negative for heat intolerance and polyphagia.  Hematologic/Lymphatic: Negative for bleeding problem. Does not bruise/bleed easily.  Skin: Negative for  flushing, nail changes, rash and suspicious lesions.  Musculoskeletal: Negative for arthritis, joint pain, muscle cramps, myalgias, neck pain and stiffness.  Gastrointestinal: Negative for abdominal pain, bowel incontinence, diarrhea and excessive appetite.  Genitourinary: Negative for decreased libido, genital sores and incomplete emptying.  Neurological: Negative for brief paralysis, focal weakness, headaches and loss of balance.  Psychiatric/Behavioral: Negative for altered mental status, depression and suicidal ideas.  Allergic/Immunologic: Negative for HIV exposure and persistent infections.    EKGs/Labs/Other Studies Reviewed:    The following studies were reviewed today:   EKG:  The ekg ordered today demonstrates sinus rhythm, heart rate 96 bpm with nonspecific interventricular conduction defect.  Recent Labs: No results found for requested labs within last 8760 hours.  Recent Lipid Panel No results found for: CHOL, TRIG, HDL, CHOLHDL, VLDL, LDLCALC, LDLDIRECT  Physical Exam:    VS:  BP (!) 140/102 (BP Location: Right Arm)   Pulse 96   Ht 6\' 2"  (1.88 m)   Wt (!) 344 lb 3.2 oz (156.1 kg)   SpO2 98%   BMI 44.19 kg/m     Wt Readings from Last 3 Encounters:  10/10/20 (!) 344 lb 3.2 oz (156.1 kg)  01/02/16 (!) 343 lb (155.6 kg)  11/29/15 (!) 349 lb (158.3 kg)     GEN: Well nourished, well developed in no acute distress HEENT: Normal NECK: No JVD; No carotid bruits LYMPHATICS: No lymphadenopathy CARDIAC: S1S2 noted,RRR, no murmurs, rubs, gallops RESPIRATORY:  Clear to auscultation without rales, wheezing or rhonchi  ABDOMEN: Soft, non-tender, non-distended, +bowel sounds, no guarding. EXTREMITIES: No edema, No cyanosis, no clubbing MUSCULOSKELETAL:  No deformity  SKIN: Warm and dry NEUROLOGIC:  Alert and oriented x 3, non-focal PSYCHIATRIC:  Normal affect, good insight  ASSESSMENT:    1. Palpitations   2. Precordial pain   3. Primary hypertension   4. Morbid  obesity (HCC)    PLAN:    He is hypertensive in the office today.  What I like to do is control the patient blood pressure, he is on losartan 50 mg I am going to add Cardizem 180 mg continuous release to his regimen.  He is also help with his intermittent palpitations.  In addition, sublingual nitroglycerin prescription was sent, its protocol and 911 protocol explained and the patient vocalized understanding questions were answered to the patient's satisfaction. For shortness of breath given his history of hypertension would like to get an echocardiogram to rule out any LV dysfunction or any diastolic dysfunction. All of his questions has been answered.  In terms of his intermittent chest pain his symptoms has persisted in addition to the nitroglycerin I will have the patient undergo a Lexi stress test.  Have educated by the patient about the stress test he is agreeable to  proceed.  The patient understands the need to lose weight with diet and exercise. We have discussed specific strategies for this.  The patient is in agreement with the above plan. The patient left the office in stable condition.  The patient will follow up in 4 weeks due to medication change.   Medication Adjustments/Labs and Tests Ordered: Current medicines are reviewed at length with the patient today.  Concerns regarding medicines are outlined above.  Orders Placed This Encounter  Procedures  . Cardiac Stress Test: Informed Consent Details: Physician/Practitioner Attestation; Transcribe to consent form and obtain patient signature  . MYOCARDIAL PERFUSION IMAGING  . EKG 12-Lead  . ECHOCARDIOGRAM COMPLETE   Meds ordered this encounter  Medications  . diltiazem (CARDIZEM CD) 180 MG 24 hr capsule    Sig: Take 1 capsule (180 mg total) by mouth daily.    Dispense:  90 capsule    Refill:  3  . nitroGLYCERIN (NITROSTAT) 0.4 MG SL tablet    Sig: Place 1 tablet (0.4 mg total) under the tongue every 5 (five) minutes as  needed for chest pain.    Dispense:  25 tablet    Refill:  11    Patient Instructions  Medication Instructions:   START DILTIAZEM ER 180 MG ONCE DAILY  NTG AS NEEDED  *If you need a refill on your cardiac medications before your next appointment, please call your pharmacy*   Testing/Procedures:  Your physician has requested that you have an echocardiogram. Echocardiography is a painless test that uses sound waves to create images of your heart. It provides your doctor with information about the size and shape of your heart and how well your heart's chambers and valves are working. This procedure takes approximately one hour. There are no restrictions for this procedure.[facility]  Your physician has requested that you have a lexiscan myoview. For further information please visit [email or web address]. Please follow instruction sheet, as given.[place] OFFICE-SCHEDULE IN 4 WEEKS       Follow-Up: At [facility], you and your health needs are our priority.  As part of our continuing mission to provide you with exceptional heart care, we have created designated Provider Care Teams.  These Care Teams include your primary Cardiologist (physician) and Advanced Practice Providers (APPs -  Physician Assistants and Nurse Practitioners) who all work together to provide you with the care you need, when you need it.  We recommend signing up for the patient portal called "MyChart".  Sign up information is provided on this After Visit Summary.  MyChart is used to connect with patients for Virtual Visits (Telemedicine).  Patients are able to view lab/test results, encounter notes, upcoming appointments, etc.  Non-urgent messages can be sent to your provider as well.   To learn more about what you can do with MyChart, go to [email or web address].    Your next appointment:   4 week(s)  The format for your next appointment:   In Person  Provider:   Thomasene RippleKardie Fannie Gathright, DO       Adopting a  Healthy Lifestyle.  Know what a healthy weight is for you (roughly BMI <25) and aim to maintain this   Aim for 7+ servings of fruits and vegetables daily   65-80+ fluid ounces of water or unsweet tea for healthy kidneys   Limit to max 1 drink of alcohol per day; avoid smoking/tobacco   Limit animal fats in diet for cholesterol and heart health - choose grass fed whenever available   Avoid highly processed foods,  and foods high in saturated/trans fats   Aim for low stress - take time to unwind and care for your mental health   Aim for 150 min of moderate intensity exercise weekly for heart health, and weights twice weekly for bone health   Aim for 7-9 hours of sleep daily   When it comes to diets, agreement about the perfect plan isnt easy to find, even among the experts. Experts at the [facility] [employer] developed an idea known as the Healthy Eating Plate. Just imagine a plate divided into logical, healthy portions.   The emphasis is on diet quality:   Load up on vegetables and fruits - one-half of your plate: Aim for color and variety, and remember that potatoes dont count.   Go for whole grains - one-quarter of your plate: Whole wheat, barley, wheat berries, quinoa, oats, brown rice, and foods made with them. If you want pasta, go with whole wheat pasta.   Protein power - one-quarter of your plate: Fish, chicken, beans, and nuts are all healthy, versatile protein sources. Limit red meat.   The diet, however, does go beyond the plate, offering a few other suggestions.   Use healthy plant oils, such as olive, canola, soy, corn, sunflower and peanut. Check the labels, and avoid partially hydrogenated oil, which have unhealthy trans fats.   If youre thirsty, drink water. Coffee and tea are good in moderation, but skip sugary drinks and limit milk and dairy products to one or two daily servings.   The type of carbohydrate in the diet is more important than the  amount. Some sources of carbohydrates, such as vegetables, fruits, whole grains, and beans-are healthier than others.   Finally, stay active  Signed, Thomasene Ripple, DO  10/10/2020 11:22 AM    [facility]

## 2020-10-10 NOTE — Patient Instructions (Signed)
Medication Instructions:   START DILTIAZEM ER 180 MG ONCE DAILY  NTG AS NEEDED  *If you need a refill on your cardiac medications before your next appointment, please call your pharmacy*   Testing/Procedures:  Your physician has requested that you have an echocardiogram. Echocardiography is a painless test that uses sound waves to create images of your heart. It provides your doctor with information about the size and shape of your heart and how well your heart's chambers and valves are working. This procedure takes approximately one hour. There are no restrictions for this procedure.[facility] OFFICE  Your physician has requested that you have a lexiscan myoview. For further information please visit [email or web address] Please follow instruction sheet, as given.[place] OFFICE-SCHEDULE IN 4 WEEKS       Follow-Up: At [facility], you and your health needs are our priority.  As part of our continuing mission to provide you with exceptional heart care, we have created designated Provider Care Teams.  These Care Teams include your primary Cardiologist (physician) and Advanced Practice Providers (APPs -  Physician Assistants and Nurse Practitioners) who all work together to provide you with the care you need, when you need it.  We recommend signing up for the patient portal called "MyChart".  Sign up information is provided on this After Visit Summary.  MyChart is used to connect with patients for Virtual Visits (Telemedicine).  Patients are able to view lab/test results, encounter notes, upcoming appointments, etc.  Non-urgent messages can be sent to your provider as well.   To learn more about what you can do with MyChart, go to [email or web address].    Your next appointment:   4 week(s)  The format for your next appointment:   In Person  Provider:   Thomasene Ripple, DO

## 2020-10-16 ENCOUNTER — Telehealth: Payer: Self-pay | Admitting: Cardiology

## 2020-10-16 DIAGNOSIS — R002 Palpitations: Secondary | ICD-10-CM

## 2020-10-16 DIAGNOSIS — I10 Essential (primary) hypertension: Secondary | ICD-10-CM

## 2020-10-16 MED ORDER — DILTIAZEM HCL ER COATED BEADS 240 MG PO CP24: 240.0000 mg | ORAL_CAPSULE | Freq: Every day | ORAL | 2 refills | Status: DC

## 2020-10-16 NOTE — Telephone Encounter (Signed)
Patient called and stated that the medication he is taking, diltiazem (CARDIZEM CD) 180 MG 24 hr capsule, is not working. BP still creeps up. Last reading was 152/99. Please call to discuss. Thinks it make be stress related.

## 2020-10-16 NOTE — Telephone Encounter (Signed)
I am helping in Triage this morning. Called and spoke with patient. He notes BP is still elevated - often in the 140's to 150's/100's - even with the addition of the Cardizem. He notes heart rates have slowed a little. Typically in the 80's to 90's. He continues to have palpitations at times but does note improvement with the Cardizem. Will increase Cardizem CD to 240mg  daily. Patient states he usually takes the Cardizem CD at night. Palpitations are not necessarily worse at night. Recommended trying it in the morning with the Losartan to see if that makes a difference. Patient took last dose of Cardizem last night around 7pm. Advised patient that he can take another 180mg  tablet this morning and then can start taking the 240mg  daily tomorrow morning. Asked patient to keep a BP and heart rate log for 2 weeks and then send the results of this. If BP remains elevated, could consider switching to Coreg. He already has a follow-up visit with Dr. scheduled for 11/08/2020 so that will be a good time to follow-up. I will route message to Dr. [place] so she is aware in case she has any other recommendations.   Servando Salina, PA-C 10/16/2020 8:59 AM

## 2020-10-19 ENCOUNTER — Ambulatory Visit (INDEPENDENT_AMBULATORY_CARE_PROVIDER_SITE_OTHER): Payer: 59

## 2020-10-19 ENCOUNTER — Other Ambulatory Visit: Payer: Self-pay

## 2020-10-19 DIAGNOSIS — R002 Palpitations: Secondary | ICD-10-CM | POA: Diagnosis not present

## 2020-10-19 DIAGNOSIS — R072 Precordial pain: Secondary | ICD-10-CM

## 2020-10-19 LAB — ECHOCARDIOGRAM COMPLETE
Area-P 1/2: 4.39 cm2
S' Lateral: 3.5 cm

## 2020-10-19 NOTE — Progress Notes (Signed)
Complete echocardiogram performed.  Jimmy Morocco Gipe RDCS, RVT  

## 2020-10-22 ENCOUNTER — Telehealth: Payer: Self-pay

## 2020-10-22 NOTE — Telephone Encounter (Signed)
-----   Message from Thomasene Ripple, DO sent at 10/19/2020 10:32 PM EST ----- Echo normal.

## 2020-10-22 NOTE — Telephone Encounter (Signed)
Spoke with patient regarding results and recommendation.  Patient verbalizes understanding and is agreeable to plan of care. Advised patient to call back with any issues or concerns.  

## 2020-10-30 ENCOUNTER — Telehealth: Payer: Self-pay | Admitting: Cardiology

## 2020-10-30 NOTE — Telephone Encounter (Signed)
Spoke to the patient just now and let him know that he will need to reach out to his PCP in regards to his question about getting anxiety medication. He verbalizes understanding and thanks me for the call back.

## 2020-10-30 NOTE — Telephone Encounter (Signed)
New message:     Patient would like for a nurse to call him concering some medications.

## 2020-11-07 ENCOUNTER — Other Ambulatory Visit: Payer: Self-pay

## 2020-11-08 ENCOUNTER — Ambulatory Visit: Payer: 59 | Admitting: Cardiology

## 2020-11-19 ENCOUNTER — Telehealth (HOSPITAL_COMMUNITY): Payer: Self-pay | Admitting: *Deleted

## 2020-11-19 NOTE — Telephone Encounter (Signed)
Left message on voicemail in reference to upcoming appointment scheduled for 11/21/19. Phone number given for a call back so details instructions can be given.  Ricky Ala

## 2020-11-20 ENCOUNTER — Ambulatory Visit (INDEPENDENT_AMBULATORY_CARE_PROVIDER_SITE_OTHER): Payer: 59

## 2020-11-20 ENCOUNTER — Other Ambulatory Visit: Payer: Self-pay

## 2020-11-20 DIAGNOSIS — R072 Precordial pain: Secondary | ICD-10-CM

## 2020-11-20 DIAGNOSIS — R002 Palpitations: Secondary | ICD-10-CM | POA: Diagnosis not present

## 2020-11-20 LAB — MYOCARDIAL PERFUSION IMAGING

## 2020-11-20 MED ORDER — REGADENOSON 0.4 MG/5ML IV SOLN
0.4000 mg | Freq: Once | INTRAVENOUS | Status: AC
  Administered 2020-11-20: 0.4 mg via INTRAVENOUS

## 2020-11-20 MED ORDER — TECHNETIUM TC 99M TETROFOSMIN IV KIT
32.7000 | PACK | Freq: Once | INTRAVENOUS | Status: AC | PRN
  Administered 2020-11-20: 32.7 via INTRAVENOUS

## 2020-11-21 ENCOUNTER — Ambulatory Visit: Payer: 59

## 2020-11-21 LAB — MYOCARDIAL PERFUSION IMAGING
LV dias vol: 129 mL (ref 62–150)
LV sys vol: 51 mL
Peak HR: 126 {beats}/min
Rest HR: 99 {beats}/min
SDS: 0
SRS: 1
SSS: 1
TID: 0.92

## 2020-11-21 MED ORDER — TECHNETIUM TC 99M TETROFOSMIN IV KIT
30.1000 | PACK | Freq: Once | INTRAVENOUS | Status: AC | PRN
  Administered 2020-11-21: 30.1 via INTRAVENOUS

## 2020-11-22 ENCOUNTER — Telehealth: Payer: Self-pay | Admitting: Cardiology

## 2020-11-22 ENCOUNTER — Telehealth: Payer: Self-pay

## 2020-11-22 ENCOUNTER — Other Ambulatory Visit: Payer: Self-pay

## 2020-11-22 NOTE — Telephone Encounter (Signed)
-----   Message from Thomasene Ripple, DO sent at 11/21/2020  5:06 PM EST ----- Stress test normal.

## 2020-11-22 NOTE — Telephone Encounter (Signed)
Left message on patients voicemail to please return our call.   

## 2020-11-22 NOTE — Telephone Encounter (Signed)
Follow up: ° ° ° ° °Patient returning a call back concering his results. °

## 2020-11-22 NOTE — Telephone Encounter (Signed)
Spoke with patient regarding results and recommendation.  Patient verbalizes understanding and is agreeable to plan of care. Advised patient to call back with any issues or concerns.  

## 2020-11-26 ENCOUNTER — Ambulatory Visit (INDEPENDENT_AMBULATORY_CARE_PROVIDER_SITE_OTHER): Payer: 59 | Admitting: Cardiology

## 2020-11-26 ENCOUNTER — Encounter: Payer: Self-pay | Admitting: Cardiology

## 2020-11-26 ENCOUNTER — Other Ambulatory Visit: Payer: Self-pay

## 2020-11-26 VITALS — BP 124/92 | HR 85 | Ht 73.0 in | Wt 334.8 lb

## 2020-11-26 DIAGNOSIS — R079 Chest pain, unspecified: Secondary | ICD-10-CM

## 2020-11-26 DIAGNOSIS — R072 Precordial pain: Secondary | ICD-10-CM | POA: Diagnosis not present

## 2020-11-26 DIAGNOSIS — I10 Essential (primary) hypertension: Secondary | ICD-10-CM

## 2020-11-26 MED ORDER — LOSARTAN POTASSIUM 50 MG PO TABS: 50.0000 mg | ORAL_TABLET | Freq: Two times a day (BID) | ORAL | 3 refills | Status: DC

## 2020-11-26 MED ORDER — METOPROLOL TARTRATE 100 MG PO TABS: 100.0000 mg | ORAL_TABLET | Freq: Once | ORAL | 0 refills | Status: DC

## 2020-11-26 NOTE — Progress Notes (Signed)
Cardiology Office Note:    Date:  11/26/2020   ID:  Grant Allen, DOB [birth year], MRN [id number]  PCP:  Earlyne Iba, NP  Cardiologist:  Berniece Salines, DO  Electrophysiologist:  None   Referring MD: Earlyne Iba, NP   " I am still having chest  Have to go to the emergency department recently"  History of Present Illness:    Grant Allen is a 35 y.o. male with a hx of hypertension,, obesity, comes today for follow-up visit Patel for that he has been experiencing significant left-sided chest pain.  He notes that this pain is no different from when he had his initial chest pain.  He tells me is a left-sided aching sensation which goes for minutes and seconds at a time.  He says he has been taking nitroglycerin which seems to have helped.  But recently his last episode did not seem to cease with the nitroglycerin.  He is concerned.  He also tells me that he has had 1 visit to the emergency department since I last saw him and during that time a CT of the chest was done which was reported to be normal.  He notes that at home he is taking blood pressure and his average blood pressure seems to be in the 130s and 517O with his diastolic still in the low 100s.  No other complaints at this time.  Past Medical History:  Diagnosis Date  . Chronic back pain   . Elevated BP without diagnosis of hypertension 01/11/2020    Past Surgical History:  Procedure Laterality Date  . no past surgical history      Current Medications: Current Meds  Medication Sig  . diltiazem (CARDIZEM CD) 240 MG 24 hr capsule Take 1 capsule (240 mg total) by mouth daily.  . hydrOXYzine (ATARAX/VISTARIL) 10 MG tablet Take 10 mg by mouth 3 (three) times daily.  Marland Kitchen losartan (COZAAR) 50 MG tablet Take 1 tablet (50 mg total) by mouth in the morning and at bedtime.  . metoprolol tartrate (LOPRESSOR) 100 MG tablet Take 1 tablet (100 mg total) by mouth once for 1 dose. Take one tablet by mouth two hours prior to your cardiac CT   . Multiple Vitamin (MULTIVITAMIN WITH MINERALS) TABS tablet Take 1 tablet by mouth daily. Reported on 01/02/2016  . nitroGLYCERIN (NITROSTAT) 0.4 MG SL tablet Place 1 tablet (0.4 mg total) under the tongue every 5 (five) minutes as needed for chest pain.  . [DISCONTINUED] losartan (COZAAR) 50 MG tablet Take 50 mg by mouth daily at 6 (six) AM.      Allergies:   Patient has no known allergies.   Social History   Socioeconomic History  . Marital status: Single    Spouse name: Not on file  . Number of children: Not on file  . Years of education: Not on file  . Highest education level: Not on file  Occupational History  . Not on file  Tobacco Use  . Smoking status: Never Smoker  . Smokeless tobacco: Never Used  Substance and Sexual Activity  . Alcohol use: No    Comment: former  . Drug use: No  . Sexual activity: Yes    Birth control/protection: None  Other Topics Concern  . Not on file  Social History Narrative  . Not on file   Social Determinants of Health   Financial Resource Strain: Not on file  Food Insecurity: Not on file  Transportation Needs: Not on file  Physical Activity:  Not on file  Stress: Not on file  Social Connections: Not on file     Family History: The patient's family history includes Brain cancer in his mother; Diabetes in his mother; Hypertension in his father; Lymphoma in his brother; Thyroid disease in his father.  ROS:   Review of Systems  Constitution: Negative for decreased appetite, fever and weight gain.  HENT: Negative for congestion, ear discharge, hoarse voice and sore throat.   Eyes: Negative for discharge, redness, vision loss in right eye and visual halos.  Cardiovascular: Reports chest pain.  Negative for dyspnea on exertion, leg swelling, orthopnea and palpitations.  Respiratory: Negative for cough, hemoptysis, shortness of breath and snoring.   Endocrine: Negative for heat intolerance and polyphagia.  Hematologic/Lymphatic: Negative  for bleeding problem. Does not bruise/bleed easily.  Skin: Negative for flushing, nail changes, rash and suspicious lesions.  Musculoskeletal: Negative for arthritis, joint pain, muscle cramps, myalgias, neck pain and stiffness.  Gastrointestinal: Negative for abdominal pain, bowel incontinence, diarrhea and excessive appetite.  Genitourinary: Negative for decreased libido, genital sores and incomplete emptying.  Neurological: Negative for brief paralysis, focal weakness, headaches and loss of balance.  Psychiatric/Behavioral: Negative for altered mental status, depression and suicidal ideas.  Allergic/Immunologic: Negative for HIV exposure and persistent infections.    EKGs/Labs/Other Studies Reviewed:    The following studies were reviewed today:   EKG: None today Recent Labs: No results found for requested labs within last 8760 hours.  Recent Lipid Panel No results found for: CHOL, TRIG, HDL, CHOLHDL, VLDL, LDLCALC, LDLDIRECT  Physical Exam:    VS:  BP (!) 124/92   Pulse 85   Ht 6' 1"  (1.854 m)   Wt (!) 334 lb 12.8 oz (151.9 kg)   SpO2 97%   BMI 44.17 kg/m     Wt Readings from Last 3 Encounters:  11/26/20 (!) 334 lb 12.8 oz (151.9 kg)  11/20/20 (!) 344 lb (156 kg)  10/10/20 (!) 344 lb 3.2 oz (156.1 kg)     GEN: Well nourished, well developed in no acute distress HEENT: Normal NECK: No JVD; No carotid bruits LYMPHATICS: No lymphadenopathy CARDIAC: S1S2 noted,RRR, no murmurs, rubs, gallops RESPIRATORY:  Clear to auscultation without rales, wheezing or rhonchi  ABDOMEN: Soft, non-tender, non-distended, +bowel sounds, no guarding. EXTREMITIES: No edema, No cyanosis, no clubbing MUSCULOSKELETAL:  No deformity  SKIN: Warm and dry NEUROLOGIC:  Alert and oriented x 3, non-focal PSYCHIATRIC:  Normal affect, good insight  ASSESSMENT:    1. Chest pain of uncertain etiology   2. Primary hypertension   3. Morbid obesity ([place])   4. Precordial pain    PLAN:     So he  has had a negative stress test however the patient tells me that chest pain is now persistent and is concerning.  I like to get a more definitive test which will be able to help [place] understand if coronary artery disease is definitely playing a role here.  Therefore coronary CTA will be ordered.  He has no IV contrast dye allergy.  I spoken to the patient about this test and he is agreeable to proceed.  I also like to control his blood pressure a little more as his home blood pressure is elevated on average.  Increase losartan to 50 mg twice a day.  The patient understands the need to lose weight with diet and exercise. We have discussed specific strategies for this.  We will plan to get lipid profile in 4 weeks at his  follow-up.  The patient is in agreement with the above plan. The patient left the office in stable condition.  The patient will follow up in 4 weeks due to medication change   Medication Adjustments/Labs and Tests Ordered: Current medicines are reviewed at length with the patient today.  Concerns regarding medicines are outlined above.  Orders Placed This Encounter  Procedures  . CT CORONARY MORPH W/CTA COR W/SCORE W/CA W/CM &/OR WO/CM  . CT CORONARY FRACTIONAL FLOW RESERVE DATA PREP  . CT CORONARY FRACTIONAL FLOW RESERVE FLUID ANALYSIS  . Basic metabolic panel  . Lipid panel   Meds ordered this encounter  Medications  . losartan (COZAAR) 50 MG tablet    Sig: Take 1 tablet (50 mg total) by mouth in the morning and at bedtime.    Dispense:  180 tablet    Refill:  3  . metoprolol tartrate (LOPRESSOR) 100 MG tablet    Sig: Take 1 tablet (100 mg total) by mouth once for 1 dose. Take one tablet by mouth two hours prior to your cardiac CT    Dispense:  1 tablet    Refill:  0    Patient Instructions  Medication Instructions:  Your physician has recommended you make the following change in your medication:  INCREASE: Losartan 50 mg take one tablet by mouth twice daily.  *If  you need a refill on your cardiac medications before your next appointment, please call your pharmacy*   Lab Work: Your physician recommends that you return for lab work in: 4 weeks Lipids, BMP If you have labs (blood work) drawn today and your tests are completely normal, you will receive your results only by: Marland Kitchen MyChart Message (if you have MyChart) OR . A paper copy in the mail If you have any lab test that is abnormal or we need to change your treatment, we will call you to review the results.   Testing/Procedures: Your cardiac CT will be scheduled at one of the below locations:   [facility] [address] [place], [place] [address] [phone]  If scheduled at [facility], please arrive at the [facility] main entrance of [facility] 30 minutes prior to test start time. Proceed to the [facility] Radiology Department (first floor) to check-in and test prep.  Please follow these instructions carefully (unless otherwise directed):  On the Night Before the Test: . Be sure to Drink plenty of water. . Do not consume any caffeinated/decaffeinated beverages or chocolate 12 hours prior to your test. . Do not take any antihistamines 12 hours prior to your test.   On the Day of the Test: . Drink plenty of water. Do not drink any water within one hour of the test. . Do not eat any food 4 hours prior to the test. . You may take your regular medications prior to the test.  . Take metoprolol (Lopressor) two hours prior to test.      After the Test: . Drink plenty of water. . After receiving IV contrast, you may experience a mild flushed feeling. This is normal. . On occasion, you may experience a mild rash up to 24 hours after the test. This is not dangerous. If this occurs, you can take Benadryl 25 mg and increase your fluid intake. . If you experience trouble breathing, this can be serious. If it is severe call 911 IMMEDIATELY. If it is mild, please  call our office. . If you take any of these medications:  Glipizide/Metformin, Avandament, Glucavance, please do not take 48 hours after completing test unless otherwise instructed.   Once we have confirmed authorization from your insurance company, we will call you to set up a date and time for your test. Based on how quickly your insurance processes prior authorizations requests, please allow up to 4 weeks to be contacted for scheduling your Cardiac CT appointment. Be advised that routine Cardiac CT appointments could be scheduled as many as 8 weeks after your provider has ordered it.  For non-scheduling related questions, please contact the cardiac imaging nurse navigator should you have any questions/concerns: Marchia Bond, Cardiac Imaging Nurse Navigator Burley Saver, Interim Cardiac Imaging Nurse [place] and Vascular Services Direct Office Dial: [phone]   For scheduling needs, including cancellations and rescheduling, please call [place], [phone].     Follow-Up: At [facility], you and your health needs are our priority.  As part of our continuing mission to provide you with exceptional heart care, we have created designated Provider Care Teams.  These Care Teams include your primary Cardiologist (physician) and Advanced Practice Providers (APPs -  Physician Assistants and Nurse Practitioners) who all work together to provide you with the care you need, when you need it.  We recommend signing up for the patient portal called "MyChart".  Sign up information is provided on this After Visit Summary.  MyChart is used to connect with patients for Virtual Visits (Telemedicine).  Patients are able to view lab/test results, encounter notes, upcoming appointments, etc.  Non-urgent messages can be sent to your provider as well.   To learn more about what you can do with MyChart, go to [email or web address].    Your next appointment:   4 week(s)  The format for  your next appointment:   In Person  Provider:   Berniece Salines, DO   Other Instructions      Adopting a Healthy Lifestyle.  Know what a healthy weight is for you (roughly BMI <25) and aim to maintain this   Aim for 7+ servings of fruits and vegetables daily   65-80+ fluid ounces of water or unsweet tea for healthy kidneys   Limit to max 1 drink of alcohol per day; avoid smoking/tobacco   Limit animal fats in diet for cholesterol and heart health - choose grass fed whenever available   Avoid highly processed foods, and foods high in saturated/trans fats   Aim for low stress - take time to unwind and care for your mental health   Aim for 150 min of moderate intensity exercise weekly for heart health, and weights twice weekly for bone health   Aim for 7-9 hours of sleep daily   When it comes to diets, agreement about the perfect plan isnt easy to find, even among the experts. Experts at the [employer] developed an idea known as the Healthy Eating Plate. Just imagine a plate divided into logical, healthy portions.   The emphasis is on diet quality:   Load up on vegetables and fruits - one-half of your plate: Aim for color and variety, and remember that potatoes dont count.   Go for whole grains - one-quarter of your plate: Whole wheat, barley, wheat berries, quinoa, oats, brown rice, and foods made with them. If you want pasta, go with whole wheat pasta.   Protein power - one-quarter of your plate: Fish, chicken, beans, and nuts are all healthy, versatile protein sources. Limit red meat.   The diet, however, does  go beyond the plate, offering a few other suggestions.   Use healthy plant oils, such as olive, canola, soy, corn, sunflower and peanut. Check the labels, and avoid partially hydrogenated oil, which have unhealthy trans fats.   If youre thirsty, drink water. Coffee and tea are good in moderation, but skip sugary drinks and limit milk and dairy  products to one or two daily servings.   The type of carbohydrate in the diet is more important than the amount. Some sources of carbohydrates, such as vegetables, fruits, whole grains, and beans-are healthier than others.   Finally, stay active  Signed, Berniece Salines, DO  11/26/2020 10:01 AM    [place]

## 2020-11-26 NOTE — Patient Instructions (Signed)
Medication Instructions:  Your physician has recommended you make the following change in your medication:  INCREASE: Losartan 50 mg take one tablet by mouth twice daily.  *If you need a refill on your cardiac medications before your next appointment, please call your pharmacy*   Lab Work: Your physician recommends that you return for lab work in: 4 weeks Lipids, BMP If you have labs (blood work) drawn today and your tests are completely normal, you will receive your results only by: Marland Kitchen MyChart Message (if you have MyChart) OR . A paper copy in the mail If you have any lab test that is abnormal or we need to change your treatment, we will call you to review the results.   Testing/Procedures: Your cardiac CT will be scheduled at one of the below locations:   [facility] [address] [place],  [address] [phone]  If scheduled at [facility], please arrive at the [facility] main entrance of [facility] 30 minutes prior to test start time. Proceed to the [facility] Radiology Department (first floor) to check-in and test prep.  Please follow these instructions carefully (unless otherwise directed):  On the Night Before the Test: . Be sure to Drink plenty of water. . Do not consume any caffeinated/decaffeinated beverages or chocolate 12 hours prior to your test. . Do not take any antihistamines 12 hours prior to your test.   On the Day of the Test: . Drink plenty of water. Do not drink any water within one hour of the test. . Do not eat any food 4 hours prior to the test. . You may take your regular medications prior to the test.  . Take metoprolol (Lopressor) two hours prior to test.      After the Test: . Drink plenty of water. . After receiving IV contrast, you may experience a mild flushed feeling. This is normal. . On occasion, you may experience a mild rash up to 24 hours after the test. This is not dangerous. If this occurs, you  can take Benadryl 25 mg and increase your fluid intake. . If you experience trouble breathing, this can be serious. If it is severe call 911 IMMEDIATELY. If it is mild, please call our office. . If you take any of these medications: Glipizide/Metformin, Avandament, Glucavance, please do not take 48 hours after completing test unless otherwise instructed.   Once we have confirmed authorization from your insurance company, we will call you to set up a date and time for your test. Based on how quickly your insurance processes prior authorizations requests, please allow up to 4 weeks to be contacted for scheduling your Cardiac CT appointment. Be advised that routine Cardiac CT appointments could be scheduled as many as 8 weeks after your provider has ordered it.  For non-scheduling related questions, please contact the cardiac imaging nurse navigator should you have any questions/concerns: Marchia Bond, Cardiac Imaging Nurse Navigator Burley Saver, Interim Cardiac Imaging Nurse [place] and Vascular Services Direct Office Dial: [phone]   For scheduling needs, including cancellations and rescheduling, please call [place], [phone].     Follow-Up: At [facility], you and your health needs are our priority.  As part of our continuing mission to provide you with exceptional heart care, we have created designated Provider Care Teams.  These Care Teams include your primary Cardiologist (physician) and Advanced Practice Providers (APPs -  Physician Assistants and Nurse Practitioners) who all work together to provide you  with the care you need, when you need it.  We recommend signing up for the patient portal called "MyChart".  Sign up information is provided on this After Visit Summary.  MyChart is used to connect with patients for Virtual Visits (Telemedicine).  Patients are able to view lab/test results, encounter notes, upcoming appointments, etc.  Non-urgent messages can be  sent to your provider as well.   To learn more about what you can do with MyChart, go to [email or web address].    Your next appointment:   4 week(s)  The format for your next appointment:   In Person  Provider:   Berniece Salines, DO   Other Instructions

## 2020-12-11 ENCOUNTER — Telehealth: Payer: Self-pay | Admitting: Cardiology

## 2020-12-11 MED ORDER — LOSARTAN POTASSIUM 50 MG PO TABS: 50.0000 mg | ORAL_TABLET | Freq: Two times a day (BID) | ORAL | 3 refills | Status: DC

## 2020-12-11 NOTE — Telephone Encounter (Signed)
Request sent to [facility] in [place].

## 2020-12-11 NOTE — Telephone Encounter (Signed)
°*  STAT* If patient is at the pharmacy, call can be transferred to refill team.   1. Which medications need to be refilled? (please list name of each medication and dose if known) losartan (COZAAR) 50 MG tablet  2. Which pharmacy/location (including street and city if local pharmacy) is medication to be sent to? [id number] - [place], [place] - [address]  3. Do they need a 30 day or 90 day supply? 90 day supply

## 2020-12-13 ENCOUNTER — Telehealth (HOSPITAL_COMMUNITY): Payer: Self-pay | Admitting: *Deleted

## 2020-12-13 ENCOUNTER — Telehealth (HOSPITAL_COMMUNITY): Payer: Self-pay | Admitting: Emergency Medicine

## 2020-12-13 DIAGNOSIS — R079 Chest pain, unspecified: Secondary | ICD-10-CM

## 2020-12-13 MED ORDER — METOPROLOL TARTRATE 100 MG PO TABS: 100.0000 mg | ORAL_TABLET | Freq: Once | ORAL | 0 refills | Status: DC

## 2020-12-13 NOTE — Telephone Encounter (Signed)
Pt returning phone call regarding upcoming cardiac imaging study; pt verbalizes understanding of appt date/time, parking situation and where to check in, pre-test NPO status and medications ordered, and verified current allergies; name and call back number provided for further questions should they arise Grant [name] RN Navigator Cardiac Imaging [facility] [phone] office [phone] cell   represcribed metoprolol and sent to pharm in [name], [place]

## 2020-12-13 NOTE — Telephone Encounter (Signed)
Attempted to call patient regarding upcoming cardiac CT appointment. Left message on voicemail with name and callback number  Naly Schwanz RN Navigator Cardiac Imaging [place] [facility] [phone] Office [phone] Cell  

## 2020-12-14 ENCOUNTER — Other Ambulatory Visit: Payer: Self-pay

## 2020-12-14 ENCOUNTER — Ambulatory Visit (HOSPITAL_COMMUNITY)
Admission: RE | Admit: 2020-12-14 | Discharge: 2020-12-14 | Disposition: A | Discharge status: Home / Self Care | Payer: 59 | Source: Ambulatory Visit | Attending: Cardiology | Admitting: Cardiology

## 2020-12-14 ENCOUNTER — Ambulatory Visit (HOSPITAL_COMMUNITY): Payer: 59

## 2020-12-14 DIAGNOSIS — R079 Chest pain, unspecified: Secondary | ICD-10-CM

## 2020-12-14 MED ORDER — NITROGLYCERIN 0.4 MG SL SUBL
0.8000 mg | SUBLINGUAL_TABLET | Freq: Once | SUBLINGUAL | Status: AC
  Administered 2020-12-14: 0.8 mg via SUBLINGUAL

## 2020-12-14 MED ORDER — IOHEXOL 350 MG/ML SOLN
80.0000 mL | Freq: Once | INTRAVENOUS | Status: AC | PRN
  Administered 2020-12-14: 80 mL via INTRAVENOUS

## 2020-12-14 MED ORDER — METOPROLOL TARTRATE 5 MG/5ML IV SOLN: 5.0000 mg | INTRAVENOUS | Status: DC | PRN

## 2020-12-14 MED ORDER — NITROGLYCERIN 0.4 MG SL SUBL
SUBLINGUAL_TABLET | SUBLINGUAL | Status: AC
  Filled 2020-12-14: qty 2

## 2020-12-19 ENCOUNTER — Telehealth: Payer: Self-pay | Admitting: Cardiology

## 2020-12-19 NOTE — Telephone Encounter (Signed)
   Pt would like to ask Dr. Servando Salina if he still need to f/u with her since he got the result of his CT. He also wanted to clarify if he needs to f/u with his pcp for another test

## 2020-12-19 NOTE — Telephone Encounter (Signed)
Spoke with patient about the option of having a year follow up per Dr. Servando Salina. Patient states he is still uncomfortable in the same area. Patient encouraged to reach out to PCP to see if they want to run additional test. He states he is currently playing phone tag with his PCP. Patient verbalizes understanding and made aware we are available for appointments if he so chooses.

## 2020-12-26 ENCOUNTER — Ambulatory Visit: Payer: 59 | Admitting: Cardiology

## 2021-01-15 ENCOUNTER — Other Ambulatory Visit: Payer: Self-pay | Admitting: Student

## 2021-01-15 DIAGNOSIS — R002 Palpitations: Secondary | ICD-10-CM

## 2021-01-15 DIAGNOSIS — I10 Essential (primary) hypertension: Secondary | ICD-10-CM

## 2021-01-15 NOTE — Telephone Encounter (Signed)
This is Dr. Tobb's pt 

## 2021-01-15 NOTE — Telephone Encounter (Signed)
Refill sent to pharmacy.   

## 2021-03-11 ENCOUNTER — Other Ambulatory Visit: Payer: Self-pay

## 2021-03-13 ENCOUNTER — Ambulatory Visit: Payer: 59 | Admitting: Cardiology

## 2021-03-25 ENCOUNTER — Other Ambulatory Visit: Payer: Self-pay

## 2021-03-25 ENCOUNTER — Encounter: Payer: Self-pay | Admitting: Cardiology

## 2021-03-25 ENCOUNTER — Ambulatory Visit (INDEPENDENT_AMBULATORY_CARE_PROVIDER_SITE_OTHER): Payer: 59 | Admitting: Cardiology

## 2021-03-25 VITALS — BP 136/100 | HR 95 | Ht 73.0 in | Wt 346.8 lb

## 2021-03-25 DIAGNOSIS — R002 Palpitations: Secondary | ICD-10-CM

## 2021-03-25 DIAGNOSIS — I10 Essential (primary) hypertension: Secondary | ICD-10-CM

## 2021-03-25 MED ORDER — DILTIAZEM HCL ER COATED BEADS 120 MG PO CP24: 120.0000 mg | ORAL_CAPSULE | Freq: Every day | ORAL | 3 refills | Status: DC

## 2021-03-25 NOTE — Progress Notes (Signed)
Cardiology Office Note:    Date:  03/25/2021   ID:  Grant Allen, DOB Jan 06, 1986, MRN [id number]  PCP:  Jim Like, NP  Cardiologist:  Thomasene Ripple, DO  Electrophysiologist:  None   Referring MD: Jim Like, NP   I am trying to stay stress free  History of Present Illness:    Grant Allen is a 35 y.o. male with a hx of hypertension, obesity, comes today for follow-up visit. At his last visit he was still hypertensive, I increased his losartan to 50 mg twice daily. He reported intermittent chest pain and given his risk factors I recommended that the patient get a coronary CTA.   He did get his cta and showed no evidence of coronary artery disease.   Past Medical History:  Diagnosis Date  . Chronic back pain   . Elevated BP without diagnosis of hypertension 01/11/2020  . Hypertension 10/10/2020  . Morbid obesity (HCC) 10/10/2020  . Palpitations 10/10/2020  . Precordial pain 10/10/2020    Past Surgical History:  Procedure Laterality Date  . no past surgical history      Current Medications: Current Meds  Medication Sig  . diltiazem (CARDIZEM CD) 120 MG 24 hr capsule Take 1 capsule (120 mg total) by mouth daily.  Marland Kitchen losartan (COZAAR) 50 MG tablet Take 1 tablet (50 mg total) by mouth in the morning and at bedtime.  . nitroGLYCERIN (NITROSTAT) 0.4 MG SL tablet Place 1 tablet (0.4 mg total) under the tongue every 5 (five) minutes as needed for chest pain.     Allergies:   Patient has no known allergies.   Social History   Socioeconomic History  . Marital status: Single    Spouse name: Not on file  . Number of children: Not on file  . Years of education: Not on file  . Highest education level: Not on file  Occupational History  . Not on file  Tobacco Use  . Smoking status: Never Smoker  . Smokeless tobacco: Never Used  Substance and Sexual Activity  . Alcohol use: No    Comment: former  . Drug use: No  . Sexual activity: Yes    Birth control/protection: None   Other Topics Concern  . Not on file  Social History Narrative  . Not on file   Social Determinants of Health   Financial Resource Strain: Not on file  Food Insecurity: Not on file  Transportation Needs: Not on file  Physical Activity: Not on file  Stress: Not on file  Social Connections: Not on file     Family History: The patient's family history includes Brain cancer in his mother; Diabetes in his mother; Hypertension in his father; Lymphoma in his brother; Thyroid disease in his father.  ROS:   Review of Systems  Constitution: Negative for decreased appetite, fever and weight gain.  HENT: Negative for congestion, ear discharge, hoarse voice and sore throat.   Eyes: Negative for discharge, redness, vision loss in right eye and visual halos.  Cardiovascular: Negative for chest pain, dyspnea on exertion, leg swelling, orthopnea and palpitations.  Respiratory: Negative for cough, hemoptysis, shortness of breath and snoring.   Endocrine: Negative for heat intolerance and polyphagia.  Hematologic/Lymphatic: Negative for bleeding problem. Does not bruise/bleed easily.  Skin: Negative for flushing, nail changes, rash and suspicious lesions.  Musculoskeletal: Negative for arthritis, joint pain, muscle cramps, myalgias, neck pain and stiffness.  Gastrointestinal: Negative for abdominal pain, bowel incontinence, diarrhea and excessive appetite.  Genitourinary: Negative  for decreased libido, genital sores and incomplete emptying.  Neurological: Negative for brief paralysis, focal weakness, headaches and loss of balance.  Psychiatric/Behavioral: Negative for altered mental status, depression and suicidal ideas.  Allergic/Immunologic: Negative for HIV exposure and persistent infections.    EKGs/Labs/Other Studies Reviewed:    The following studies were reviewed today:   EKG:  The ekg ordered today demonstrates   Recent Labs: No results found for requested labs within last 8760  hours.  Recent Lipid Panel No results found for: CHOL, TRIG, HDL, CHOLHDL, VLDL, LDLCALC, LDLDIRECT  Physical Exam:    VS:  BP (!) 136/100   Pulse 95   Ht 6\' 1"  (1.854 m)   Wt (!) 346 lb 12.8 oz (157.3 kg)   SpO2 97%   BMI 45.75 kg/m     Wt Readings from Last 3 Encounters:  03/25/21 (!) 346 lb 12.8 oz (157.3 kg)  11/26/20 (!) 334 lb 12.8 oz (151.9 kg)  11/20/20 (!) 344 lb (156 kg)     GEN: Well nourished, well developed in no acute distress HEENT: Normal NECK: No JVD; No carotid bruits LYMPHATICS: No lymphadenopathy CARDIAC: S1S2 noted,RRR, no murmurs, rubs, gallops RESPIRATORY:  Clear to auscultation without rales, wheezing or rhonchi  ABDOMEN: Soft, non-tender, non-distended, +bowel sounds, no guarding. EXTREMITIES: No edema, No cyanosis, no clubbing MUSCULOSKELETAL:  No deformity  SKIN: Warm and dry NEUROLOGIC:  Alert and oriented x 3, non-focal PSYCHIATRIC:  Normal affect, good insight  ASSESSMENT:    1. Hypertension, unspecified type   2. Morbid obesity (HCC)   3. Palpitations    PLAN:     He still is hypertensive on his losartan 50 mg twice a day.  I am going to add Cardizem 120 mg daily to his regimen.  Hoping this can keep the patient less than 130/80 mmHg and help with his symptoms of palpitations as well.  I ask the patient to take his blood for atleast 2 weeks and share this information with me via mychart - he is agreeble.  The patient understands the need to lose weight with diet and exercise. We have discussed specific strategies for this.  The patient is in agreement with the above plan. The patient left the office in stable condition.  The patient will follow up in 6 months or sooner if needed.   Medication Adjustments/Labs and Tests Ordered: Current medicines are reviewed at length with the patient today.  Concerns regarding medicines are outlined above.  No orders of the defined types were placed in this encounter.  Meds ordered this encounter   Medications  . diltiazem (CARDIZEM CD) 120 MG 24 hr capsule    Sig: Take 1 capsule (120 mg total) by mouth daily.    Dispense:  90 capsule    Refill:  3    Patient Instructions  Medication Instructions:  Your physician has recommended you make the following change in your medication:  START: Cardizem 120 mg once daily *If you need a refill on your cardiac medications before your next appointment, please call your pharmacy*   Lab Work: None If you have labs (blood work) drawn today and your tests are completely normal, you will receive your results only by: 01/18/21 MyChart Message (if you have MyChart) OR . A paper copy in the mail If you have any lab test that is abnormal or we need to change your treatment, we will call you to review the results.   Testing/Procedures: None   Follow-Up: At [facility], you  and your health needs are our priority.  As part of our continuing mission to provide you with exceptional heart care, we have created designated Provider Care Teams.  These Care Teams include your primary Cardiologist (physician) and Advanced Practice Providers (APPs -  Physician Assistants and Nurse Practitioners) who all work together to provide you with the care you need, when you need it.  We recommend signing up for the patient portal called "MyChart".  Sign up information is provided on this After Visit Summary.  MyChart is used to connect with patients for Virtual Visits (Telemedicine).  Patients are able to view lab/test results, encounter notes, upcoming appointments, etc.  Non-urgent messages can be sent to your provider as well.   To learn more about what you can do with MyChart, go to [email or web address].    Your next appointment:   6 month(s)  The format for your next appointment:   In Person  Provider:   Thomasene Ripple, DO   Other Instructions      Adopting a Healthy Lifestyle.  Know what a healthy weight is for you (roughly BMI <25) and aim to  maintain this   Aim for 7+ servings of fruits and vegetables daily   65-80+ fluid ounces of water or unsweet tea for healthy kidneys   Limit to max 1 drink of alcohol per day; avoid smoking/tobacco   Limit animal fats in diet for cholesterol and heart health - choose grass fed whenever available   Avoid highly processed foods, and foods high in saturated/trans fats   Aim for low stress - take time to unwind and care for your mental health   Aim for 150 min of moderate intensity exercise weekly for heart health, and weights twice weekly for bone health   Aim for 7-9 hours of sleep daily   When it comes to diets, agreement about the perfect plan isnt easy to find, even among the experts. Experts at the [facility] [employer] developed an idea known as the Healthy Eating Plate. Just imagine a plate divided into logical, healthy portions.   The emphasis is on diet quality:   Load up on vegetables and fruits - one-half of your plate: Aim for color and variety, and remember that potatoes dont count.   Go for whole grains - one-quarter of your plate: Whole wheat, barley, wheat berries, quinoa, oats, brown rice, and foods made with them. If you want pasta, go with whole wheat pasta.   Protein power - one-quarter of your plate: Fish, chicken, beans, and nuts are all healthy, versatile protein sources. Limit red meat.   The diet, however, does go beyond the plate, offering a few other suggestions.   Use healthy plant oils, such as olive, canola, soy, corn, sunflower and peanut. Check the labels, and avoid partially hydrogenated oil, which have unhealthy trans fats.   If youre thirsty, drink water. Coffee and tea are good in moderation, but skip sugary drinks and limit milk and dairy products to one or two daily servings.   The type of carbohydrate in the diet is more important than the amount. Some sources of carbohydrates, such as vegetables, fruits, whole grains, and beans-are  healthier than others.   Finally, stay active  Signed, Thomasene Ripple, DO  03/25/2021 10:01 PM    [facility]

## 2021-03-25 NOTE — Patient Instructions (Signed)
Medication Instructions:  Your physician has recommended you make the following change in your medication:  START: Cardizem 120 mg once daily *If you need a refill on your cardiac medications before your next appointment, please call your pharmacy*   Lab Work: None If you have labs (blood work) drawn today and your tests are completely normal, you will receive your results only by: Marland Kitchen MyChart Message (if you have MyChart) OR . A paper copy in the mail If you have any lab test that is abnormal or we need to change your treatment, we will call you to review the results.   Testing/Procedures: None   Follow-Up: At [facility], you and your health needs are our priority.  As part of our continuing mission to provide you with exceptional heart care, we have created designated Provider Care Teams.  These Care Teams include your primary Cardiologist (physician) and Advanced Practice Providers (APPs -  Physician Assistants and Nurse Practitioners) who all work together to provide you with the care you need, when you need it.  We recommend signing up for the patient portal called "MyChart".  Sign up information is provided on this After Visit Summary.  MyChart is used to connect with patients for Virtual Visits (Telemedicine).  Patients are able to view lab/test results, encounter notes, upcoming appointments, etc.  Non-urgent messages can be sent to your provider as well.   To learn more about what you can do with MyChart, go to [email or web address].    Your next appointment:   6 month(s)  The format for your next appointment:   In Person  Provider:   Thomasene Ripple, DO   Other Instructions

## 2021-03-26 ENCOUNTER — Telehealth: Payer: Self-pay | Admitting: Cardiology

## 2021-03-26 MED ORDER — DILTIAZEM HCL ER COATED BEADS 120 MG PO CP24
120.0000 mg | ORAL_CAPSULE | Freq: Every day | ORAL | 3 refills | Status: DC
Start: 2021-03-26 — End: 2021-03-28

## 2021-03-26 NOTE — Telephone Encounter (Signed)
Pt c/o medication issue:  1. Name of Medication: diltiazem (CARDIZEM CD) 120 MG 24 hr capsule  2. How are you currently taking this medication (dosage and times per day)? As written   3. Are you having a reaction (difficulty breathing--STAT)?  No   4. What is your medication issue?  Medication was sent to wrong pharmacy. Please resend medication to [facility] [address] [facility], [place] - [address]

## 2021-03-26 NOTE — Telephone Encounter (Signed)
Refill sent in per request.  

## 2021-03-28 ENCOUNTER — Telehealth: Payer: Self-pay | Admitting: Cardiology

## 2021-03-28 MED ORDER — DILTIAZEM HCL ER COATED BEADS 120 MG PO CP24: 120.0000 mg | ORAL_CAPSULE | Freq: Every day | ORAL | 3 refills | Status: DC

## 2021-03-28 NOTE — Telephone Encounter (Signed)
Rx refill sent to pharmacy. 

## 2021-03-28 NOTE — Telephone Encounter (Signed)
*  STAT* If patient is at the pharmacy, call can be transferred to refill team.   1. Which medications need to be refilled? (please list name of each medication and dose if known)  diltiazem (CARDIZEM CD) 120 MG 24 hr capsule  2. Which pharmacy/location (including street and city if local pharmacy) is medication to be sent to? [facility] - [place], [place] - [address]  3. Do they need a 30 day or 90 day supply?  90 day supply    PT is all out of this medication.Medication was sent to [facility] instead of [facility]

## 2021-04-01 ENCOUNTER — Telehealth: Payer: Self-pay | Admitting: Cardiology

## 2021-04-01 NOTE — Telephone Encounter (Signed)
Pt c/o medication issue:  1. Name of Medication: diltiazem (CARDIZEM CD) 120 MG 24 hr capsule  2. How are you currently taking this medication (dosage and times per day)? Not currently taking   3. Are you having a reaction (difficulty breathing--STAT)? No   4. What is your medication issue? Grant Allen is calling stating this prescription was not received by his pharmacy. I called the pharmacy to confirm due to them sending confirmation it was while having Grant Allen on the line. The pharmacy stated they did receive the prescription, but based on their records it is not due to be filled until July. I'm assuming this means a PA is required. Please advise

## 2021-04-08 ENCOUNTER — Telehealth: Payer: Self-pay

## 2021-04-08 ENCOUNTER — Other Ambulatory Visit: Payer: Self-pay

## 2021-04-08 MED ORDER — DILTIAZEM HCL ER COATED BEADS 120 MG PO CP24: 120.0000 mg | ORAL_CAPSULE | Freq: Every day | ORAL | 3 refills | Status: DC

## 2021-04-08 NOTE — Telephone Encounter (Signed)
Spoke with patient about the refill for his Cardizem, patient states the refill was to be sent to [facility] in [place] not [facility]. Refill was resent to [place] today. I called [facility] to cancel the refill that was sent to them, staff states the medication was on hold but she is unaware why it was on hold. No further acted needed at this time.

## 2021-05-09 ENCOUNTER — Other Ambulatory Visit: Payer: Self-pay

## 2021-05-09 ENCOUNTER — Ambulatory Visit (INDEPENDENT_AMBULATORY_CARE_PROVIDER_SITE_OTHER): Payer: 59

## 2021-05-09 ENCOUNTER — Telehealth: Payer: Self-pay

## 2021-05-09 ENCOUNTER — Telehealth: Payer: Self-pay | Admitting: Cardiology

## 2021-05-09 DIAGNOSIS — R002 Palpitations: Secondary | ICD-10-CM

## 2021-05-09 MED ORDER — VALSARTAN 80 MG PO TABS: 80.0000 mg | ORAL_TABLET | Freq: Every day | ORAL | 3 refills | Status: DC

## 2021-05-09 NOTE — Progress Notes (Signed)
Prescription sent to pharmacy.

## 2021-05-09 NOTE — Telephone Encounter (Signed)
Pt is reaching out to inform Dr. Servando Salina that the rx Ditiazem is not working for him... please advise

## 2021-05-09 NOTE — Telephone Encounter (Signed)
Spoke with patient, see chart.    

## 2021-05-09 NOTE — Telephone Encounter (Signed)
Patient states he is still at the same point as he was before starting taking medication. He states it took 2 weeks to just get the medication. Still have episodes 146/114 94;  158/101; 132/95; 146/99. He reports hand shaking  all the time, even in a relaxed state as well, even well laying down still experiences symptoms. Patient was put on Diltiazem 180 mg before for a short period of time. Will relay information to Dr. Servando Salina.

## 2021-05-14 DIAGNOSIS — R002 Palpitations: Secondary | ICD-10-CM

## 2021-06-13 ENCOUNTER — Telehealth: Payer: Self-pay | Admitting: Cardiology

## 2021-06-13 MED ORDER — VALSARTAN 160 MG PO TABS: 160.0000 mg | ORAL_TABLET | Freq: Every day | ORAL | 3 refills | Status: DC

## 2021-06-13 NOTE — Telephone Encounter (Signed)
Spoke to patient he stated his B/P has been elevated.Ranging 150/100 to 140/90. Pulse in 80's.Stated he is out of Valsartan and would like dose increased.Stated he is very anxious and would like something for anxiety.Advised I will send message to Dr.Tobb for advice.

## 2021-06-13 NOTE — Telephone Encounter (Signed)
Pt c/o medication issue:  1. Name of Medication: valsartan (DIOVAN) 80 MG tablet  2. How are you currently taking this medication (dosage and times per day)? sprescribed  3. Are you having a reaction (difficulty breathing--STAT)? No  4. What is your medication issue? Pt is calling with concerns in regards to this medication.He feels the medication needs to be increased in dosage

## 2021-06-13 NOTE — Telephone Encounter (Signed)
Spoke to patient Dr.Munley's advice given.Advised to continue to monitor B/P and call back if elevated.

## 2021-07-25 ENCOUNTER — Telehealth: Payer: Self-pay | Admitting: Cardiology

## 2021-07-25 NOTE — Telephone Encounter (Signed)
Grant Allen is calling in regards to getting a medicine for  his anxiety. Pt stated that Dr. Servando Salina had mentioned it to him during hi last visit, but he does not remember the name. Please advise pt further

## 2021-07-25 NOTE — Telephone Encounter (Signed)
Left message for patient to return the call.

## 2021-07-26 ENCOUNTER — Telehealth: Payer: Self-pay

## 2021-07-26 MED ORDER — PROPRANOLOL HCL 40 MG PO TABS: 40.0000 mg | ORAL_TABLET | Freq: Three times a day (TID) | ORAL | 4 refills | Status: DC

## 2021-07-26 NOTE — Telephone Encounter (Signed)
Pt returned my call. He states the feels the Cardizem does not work for him. After speaking with Dr. Servando Salina, discontinued the Cardizem ans states Propranolol 40 mg every 8 hours. Pt agreeable with medication change. No further questions at this time.

## 2021-11-22 ENCOUNTER — Ambulatory Visit: Payer: 59 | Admitting: Cardiology

## 2021-12-10 ENCOUNTER — Telehealth: Payer: Self-pay | Admitting: Cardiology

## 2021-12-10 NOTE — Telephone Encounter (Unsigned)
Returned the call to the patient. He stated that he had a sleep study completed last year and has a prescription for a CPAP machine but could not afford it then. He was calling to see if he could have a new prescription for the CPAP since he is now able to get one. His insurance company stated that he does not need a new sleep study.  He stated that he thought Dr. Servando Salina has ordered the sleep study. He has been advised that it does not look like this was ordered by Dr. Servando Salina. He will call his other provider as well to see who actually ordered it.

## 2021-12-10 NOTE — Telephone Encounter (Signed)
Patient is calling requesting an order for a sleep study be made. He had one done last year, but could not afford the CPAP at the time. Please advise.

## 2021-12-11 ENCOUNTER — Telehealth: Payer: Self-pay | Admitting: Cardiology

## 2021-12-11 NOTE — Telephone Encounter (Signed)
Pt to be seen 1/31. Per Dr. Servando Salina will be addressed at that appt.

## 2021-12-11 NOTE — Telephone Encounter (Signed)
Patient called wanting to know if we could request his records from [facility], phone # [phone].  He said they had order him a C-PAP machine last year but he couldn't afford it then.

## 2021-12-17 ENCOUNTER — Encounter: Payer: Self-pay | Admitting: Cardiology

## 2021-12-17 ENCOUNTER — Other Ambulatory Visit: Payer: Self-pay

## 2021-12-17 ENCOUNTER — Ambulatory Visit (INDEPENDENT_AMBULATORY_CARE_PROVIDER_SITE_OTHER): Payer: Managed Care, Other (non HMO) | Admitting: Cardiology

## 2021-12-17 VITALS — BP 140/90 | HR 83 | Ht 74.0 in | Wt 357.2 lb

## 2021-12-17 DIAGNOSIS — Z131 Encounter for screening for diabetes mellitus: Secondary | ICD-10-CM

## 2021-12-17 DIAGNOSIS — I10 Essential (primary) hypertension: Secondary | ICD-10-CM | POA: Diagnosis not present

## 2021-12-17 DIAGNOSIS — Z79899 Other long term (current) drug therapy: Secondary | ICD-10-CM

## 2021-12-17 DIAGNOSIS — R600 Localized edema: Secondary | ICD-10-CM

## 2021-12-17 DIAGNOSIS — E8881 Metabolic syndrome: Secondary | ICD-10-CM

## 2021-12-17 DIAGNOSIS — R002 Palpitations: Secondary | ICD-10-CM | POA: Diagnosis not present

## 2021-12-17 DIAGNOSIS — G4733 Obstructive sleep apnea (adult) (pediatric): Secondary | ICD-10-CM

## 2021-12-17 DIAGNOSIS — R5383 Other fatigue: Secondary | ICD-10-CM

## 2021-12-17 MED ORDER — METOPROLOL SUCCINATE ER 25 MG PO TB24: 25.0000 mg | ORAL_TABLET | Freq: Every day | ORAL | 3 refills | Status: DC

## 2021-12-17 MED ORDER — POTASSIUM CHLORIDE CRYS ER 20 MEQ PO TBCR: 20.0000 meq | EXTENDED_RELEASE_TABLET | ORAL | 3 refills | Status: DC

## 2021-12-17 MED ORDER — FUROSEMIDE 40 MG PO TABS: 40.0000 mg | ORAL_TABLET | ORAL | 3 refills | Status: AC

## 2021-12-17 NOTE — Patient Instructions (Signed)
Medication Instructions:  Your physician has recommended you make the following change in your medication:  START: Metoprolol succinate 25 mg once daily START: Lasix 40 mg once weekly START: Potassium 20 meq once weekly *If you need a refill on your cardiac medications before your next appointment, please call your pharmacy*   Lab Work: Your physician recommends that you return for lab work in:  TODAY: BMET, Mag, CBC, HgbA1c, Vitamin D If you have labs (blood work) drawn today and your tests are completely normal, you will receive your results only by: [place] (if you have MyChart) OR A paper copy in the mail If you have any lab test that is abnormal or we need to change your treatment, we will call you to review the results.   Testing/Procedures:    Follow-Up: At [facility], you and your health needs are our priority.  As part of our continuing mission to provide you with exceptional heart care, we have created designated Provider Care Teams.  These Care Teams include your primary Cardiologist (physician) and Advanced Practice Providers (APPs -  Physician Assistants and Nurse Practitioners) who all work together to provide you with the care you need, when you need it.  We recommend signing up for the patient portal called "MyChart".  Sign up information is provided on this After Visit Summary.  MyChart is used to connect with patients for Virtual Visits (Telemedicine).  Patients are able to view lab/test results, encounter notes, upcoming appointments, etc.  Non-urgent messages can be sent to your provider as well.   To learn more about what you can do with MyChart, go to [email or web address].    Your next appointment:   6 month(s)  The format for your next appointment:   In Person  Provider:   Berniece Salines, DO     Other Instructions

## 2021-12-17 NOTE — Progress Notes (Signed)
Cardiology Office Note:    Date:  12/17/2021   ID:  Grant Allen, DOB 10/19/1986, MRN [id number]  PCP:  Jim Like, NP  Cardiologist:  Thomasene Ripple, DO  Electrophysiologist:  None   Referring MD: Jim Like, NP   "I am still having palpitations with some chest discomfort fatigue"  History of Present Illness:    Grant Allen is a 36 y.o. male with a hx of hypertension, obesity who is here today for follow-up visit.  I saw the patient on November 26, 2020 at that time we discussed his elevated blood pressure and he was experiencing chest discomfort so I sent the patient for coronary CT scan.  He did have a coronary CT scan we did not show any evidence of coronary artery disease.  He is here today for follow-up visit.  Since his last visit he tells me that he has stopped her propanolol.  He felt as if it was not working for him.  In the meantime he has been experiencing significant palpitations.  He has had some intermittent chest discomfort but they have resolved which now he is suspecting that this may be related to anxiety given the fact that is off and on during those times.  But was news with the patient is in fact he is having significant fatigue.  He shares with me that he had a sleep study with his previous PCP which noted that he had sleep apnea.  Unfortunately at the time he did not have the money for the CPAP and since that time the PCP had left.  In the practice does not want to help him pursue the CPAP.  Past Medical History:  Diagnosis Date   Chronic back pain    Elevated BP without diagnosis of hypertension 01/11/2020   Hypertension 10/10/2020   Morbid obesity (HCC) 10/10/2020   Palpitations 10/10/2020   Precordial pain 10/10/2020    Past Surgical History:  Procedure Laterality Date   no past surgical history      Current Medications: Current Meds  Medication Sig   furosemide (LASIX) 40 MG tablet Take 1 tablet (40 mg total) by mouth once a week.   metoprolol  succinate (TOPROL XL) 25 MG 24 hr tablet Take 1 tablet (25 mg total) by mouth daily.   omeprazole (PRILOSEC) 40 MG capsule Take 40 mg by mouth daily.   potassium chloride SA (KLOR-CON M20) 20 MEQ tablet Take 1 tablet (20 mEq total) by mouth once a week.   valsartan (DIOVAN) 160 MG tablet Take 1 tablet (160 mg total) by mouth daily.     Allergies:   Patient has no known allergies.   Social History   Socioeconomic History   Marital status: Single    Spouse name: Not on file   Number of children: Not on file   Years of education: Not on file   Highest education level: Not on file  Occupational History   Not on file  Tobacco Use   Smoking status: Never   Smokeless tobacco: Never  Substance and Sexual Activity   Alcohol use: No    Comment: former   Drug use: No   Sexual activity: Yes    Birth control/protection: None  Other Topics Concern   Not on file  Social History Narrative   Not on file   Social Determinants of Health   Financial Resource Strain: Not on file  Food Insecurity: Not on file  Transportation Needs: Not on file  Physical Activity: Not  on file  Stress: Not on file  Social Connections: Not on file     Family History: The patient's family history includes Brain cancer in his mother; Diabetes in his mother; Hypertension in his father; Lymphoma in his brother; Thyroid disease in his father.  ROS:   Review of Systems  Constitution: Negative for decreased appetite, fever and weight gain.  HENT: Negative for congestion, ear discharge, hoarse voice and sore throat.   Eyes: Negative for discharge, redness, vision loss in right eye and visual halos.  Cardiovascular: Reports chest pain, dyspnea on exertion and palpitation.  Negative for, leg swelling, orthopnea. Respiratory: Negative for cough, hemoptysis, shortness of breath and snoring.   Endocrine: Negative for heat intolerance and polyphagia.  Hematologic/Lymphatic: Negative for bleeding problem. Does not  bruise/bleed easily.  Skin: Negative for flushing, nail changes, rash and suspicious lesions.  Musculoskeletal: Negative for arthritis, joint pain, muscle cramps, myalgias, neck pain and stiffness.  Gastrointestinal: Negative for abdominal pain, bowel incontinence, diarrhea and excessive appetite.  Genitourinary: Negative for decreased libido, genital sores and incomplete emptying.  Neurological: Negative for brief paralysis, focal weakness, headaches and loss of balance.  Psychiatric/Behavioral: Negative for altered mental status, depression and suicidal ideas.  Allergic/Immunologic: Negative for HIV exposure and persistent infections.    EKGs/Labs/Other Studies Reviewed:    The following studies were reviewed today:   EKG:  The ekg ordered today demonstrates sinus rhythm, heart rate 83 bpm.  Coronary CT scan December 14, 2020 Aorta: Normal size.  No calcifications.  No dissection.   Aortic Valve:  Trileaflet.  No calcifications.   Coronary Arteries:  Normal coronary origin.  Right dominance.   RCA is a large dominant artery that gives rise to PDA and PLVB. There is no plaque. There is a small stair-step artifact in the distal RCA.   Left main is a large artery that gives rise to LAD and LCX arteries.   LAD is a large vessel that has no plaque.   LCX is a non-dominant artery that gives rise to one large OM1 branch. There is no plaque.   Other findings:   Normal pulmonary vein drainage into the left atrium.   Normal left atrial appendage without a thrombus.   Normal size of the pulmonary artery.   IMPRESSION: 1. Coronary calcium score of 0. This was 0 percentile for age and sex matched control.   2. Normal coronary origin with right dominance.   3. No evidence of CAD.   Thomasene Ripple, DO      ZIO monitor June 05, 2021 Patch Wear Time:  13 days and 9 hours starting May 14, 2021. Indications: Palpitations   Patient had a minimum HR of 50 bpm, maximum HR of 172  bpm, and average HR of 83 bpm. Predominant underlying rhythm was Sinus Rhythm.   Premature atrial complexes were rare (<1.0%). Premature ventricular complexes were rare (<1.0%).    No ventricular tachycardia, no atrial fibrillation no AV block noted.   Symptoms associated with sinus rhythm and sinus tachycardia.   Conclusion: Normal/unremarkable study with no evidence of significant arrhythmia.  Recent Labs: 12/17/2021: BUN 9; Creatinine, Ser 0.88; Hemoglobin 15.5; Magnesium 2.1; Platelets 317; Potassium 4.1; Sodium 139  Recent Lipid Panel No results found for: CHOL, TRIG, HDL, CHOLHDL, VLDL, LDLCALC, LDLDIRECT  Physical Exam:    VS:  BP 140/90 (BP Location: Left Arm, Patient Position: Sitting)    Pulse 83    Ht 6\' 2"  (1.88 m)    Wt (!) 357  lb 3.2 oz (162 kg)    SpO2 99%    BMI 45.86 kg/m     Wt Readings from Last 3 Encounters:  12/17/21 (!) 357 lb 3.2 oz (162 kg)  03/25/21 (!) 346 lb 12.8 oz (157.3 kg)  11/26/20 (!) 334 lb 12.8 oz (151.9 kg)     GEN: Well nourished, well developed in no acute distress HEENT: Normal NECK: No JVD; No carotid bruits LYMPHATICS: No lymphadenopathy CARDIAC: S1S2 noted,RRR, no murmurs, rubs, gallops RESPIRATORY:  Clear to auscultation without rales, wheezing or rhonchi  ABDOMEN: Soft, non-tender, non-distended, +bowel sounds, no guarding. EXTREMITIES: No edema, No cyanosis, no clubbing MUSCULOSKELETAL:  No deformity  SKIN: Warm and dry NEUROLOGIC:  Alert and oriented x 3, non-focal PSYCHIATRIC:  Normal affect, good insight  ASSESSMENT:    1. Hypertension, unspecified type   2. Morbid obesity (HCC)   3. Palpitations   4. Bilateral leg edema   5. OSA (obstructive sleep apnea)   6. Screening for diabetes mellitus   7. Fatigue, unspecified type   8. Metabolic syndrome   9. Medication management    PLAN:    I will refer to pulmonary to help with addressing his sleep apnea and CPAP. I will refer him to to our pharmacy team for weight loss  management He does have leg edema we will give Lasix once a weekly. Will replace propranolol with metoprolol hopefully this will help with his palpitations. Will check blood work which will help with understanding for vitamin D level for his fatigue, screening for diabetes with his hemoglobin A1c as well as getting information on his electrolyte with BMP as well as mag.  Making sure that he does not have evidence of anemia in the setting of his fatigue so CBC will be done as well.  The patient is in agreement with the above plan. The patient left the office in stable condition.  The patient will follow up in   Medication Adjustments/Labs and Tests Ordered: Current medicines are reviewed at length with the patient today.  Concerns regarding medicines are outlined above.  Orders Placed This Encounter  Procedures   Basic Metabolic Panel (BMET)   Magnesium   CBC with Differential/Platelet   VITAMIN D 25 Hydroxy (Vit-D Deficiency, Fractures)   HgB A1c   Ambulatory referral to Pulmonology   AMB Referral to [facility]   EKG 12-Lead   Meds ordered this encounter  Medications   metoprolol succinate (TOPROL XL) 25 MG 24 hr tablet    Sig: Take 1 tablet (25 mg total) by mouth daily.    Dispense:  90 tablet    Refill:  3   furosemide (LASIX) 40 MG tablet    Sig: Take 1 tablet (40 mg total) by mouth once a week.    Dispense:  13 tablet    Refill:  3   potassium chloride SA (KLOR-CON M20) 20 MEQ tablet    Sig: Take 1 tablet (20 mEq total) by mouth once a week.    Dispense:  13 tablet    Refill:  3    Patient Instructions  Medication Instructions:  Your physician has recommended you make the following change in your medication:  START: Metoprolol succinate 25 mg once daily START: Lasix 40 mg once weekly START: Potassium 20 meq once weekly *If you need a refill on your cardiac medications before your next appointment, please call your pharmacy*   Lab Work: Your physician  recommends that you return for lab work in:  TODAY: BMET, Mag, CBC, HgbA1c, Vitamin D If you have labs (blood work) drawn today and your tests are completely normal, you will receive your results only by: MyChart Message (if you have MyChart) OR A paper copy in the mail If you have any lab test that is abnormal or we need to change your treatment, we will call you to review the results.   Testing/Procedures:    Follow-Up: At [facility], you and your health needs are our priority.  As part of our continuing mission to provide you with exceptional heart care, we have created designated Provider Care Teams.  These Care Teams include your primary Cardiologist (physician) and Advanced Practice Providers (APPs -  Physician Assistants and Nurse Practitioners) who all work together to provide you with the care you need, when you need it.  We recommend signing up for the patient portal called "MyChart".  Sign up information is provided on this After Visit Summary.  MyChart is used to connect with patients for Virtual Visits (Telemedicine).  Patients are able to view lab/test results, encounter notes, upcoming appointments, etc.  Non-urgent messages can be sent to your provider as well.   To learn more about what you can do with MyChart, go to [email or web address].    Your next appointment:   6 month(s)  The format for your next appointment:   In Person  Provider:   Thomasene RippleKardie Carma Dwiggins, DO     Other Instructions     Adopting a Healthy Lifestyle.  Know what a healthy weight is for you (roughly BMI <25) and aim to maintain this   Aim for 7+ servings of fruits and vegetables daily   65-80+ fluid ounces of water or unsweet tea for healthy kidneys   Limit to max 1 drink of alcohol per day; avoid smoking/tobacco   Limit animal fats in diet for cholesterol and heart health - choose grass fed whenever available   Avoid highly processed foods, and foods high in saturated/trans fats   Aim for  low stress - take time to unwind and care for your mental health   Aim for 150 min of moderate intensity exercise weekly for heart health, and weights twice weekly for bone health   Aim for 7-9 hours of sleep daily   When it comes to diets, agreement about the perfect plan isnt easy to find, even among the experts. Experts at the [employer] developed an idea known as the Healthy Eating Plate. Just imagine a plate divided into logical, healthy portions.   The emphasis is on diet quality:   Load up on vegetables and fruits - one-half of your plate: Aim for color and variety, and remember that potatoes dont count.   Go for whole grains - one-quarter of your plate: Whole wheat, barley, wheat berries, quinoa, oats, brown rice, and foods made with them. If you want pasta, go with whole wheat pasta.   Protein power - one-quarter of your plate: Fish, chicken, beans, and nuts are all healthy, versatile protein sources. Limit red meat.   The diet, however, does go beyond the plate, offering a few other suggestions.   Use healthy plant oils, such as olive, canola, soy, corn, sunflower and peanut. Check the labels, and avoid partially hydrogenated oil, which have unhealthy trans fats.   If youre thirsty, drink water. Coffee and tea are good in moderation, but skip sugary drinks and limit milk and dairy products to one or two daily servings.   The type  of carbohydrate in the diet is more important than the amount. Some sources of carbohydrates, such as vegetables, fruits, whole grains, and beans-are healthier than others.   Finally, stay active  Signed, Thomasene Ripple, DO  12/17/2021 7:36 PM    [facility]

## 2021-12-18 LAB — CBC WITH DIFFERENTIAL/PLATELET
Basophils Absolute: 0.1 10*3/uL (ref 0.0–0.2)
Basos: 1 %
EOS (ABSOLUTE): 0.1 10*3/uL (ref 0.0–0.4)
Eos: 1 %
Hematocrit: 44.7 % (ref 37.5–51.0)
Hemoglobin: 15.5 g/dL (ref 13.0–17.7)
Immature Grans (Abs): 0 10*3/uL (ref 0.0–0.1)
Immature Granulocytes: 0 %
Lymphocytes Absolute: 1.5 10*3/uL (ref 0.7–3.1)
Lymphs: 20 %
MCH: 30.4 pg (ref 26.6–33.0)
MCHC: 34.7 g/dL (ref 31.5–35.7)
MCV: 88 fL (ref 79–97)
Monocytes Absolute: 0.5 10*3/uL (ref 0.1–0.9)
Monocytes: 7 %
Neutrophils Absolute: 5.1 10*3/uL (ref 1.4–7.0)
Neutrophils: 71 %
Platelets: 317 10*3/uL (ref 150–450)
RBC: 5.1 x10E6/uL (ref 4.14–5.80)
RDW: 12.5 % (ref 11.6–15.4)
WBC: 7.2 10*3/uL (ref 3.4–10.8)

## 2021-12-18 LAB — HEMOGLOBIN A1C
Est. average glucose Bld gHb Est-mCnc: 108 mg/dL
Hgb A1c MFr Bld: 5.4 % (ref 4.8–5.6)

## 2021-12-18 LAB — BASIC METABOLIC PANEL
BUN/Creatinine Ratio: 10 (ref 9–20)
BUN: 9 mg/dL (ref 6–20)
CO2: 26 mmol/L (ref 20–29)
Calcium: 9.3 mg/dL (ref 8.7–10.2)
Chloride: 101 mmol/L (ref 96–106)
Creatinine, Ser: 0.88 mg/dL (ref 0.76–1.27)
Glucose: 98 mg/dL (ref 70–99)
Potassium: 4.1 mmol/L (ref 3.5–5.2)
Sodium: 139 mmol/L (ref 134–144)
eGFR: 114 mL/min/{1.73_m2} (ref >59)

## 2021-12-18 LAB — MAGNESIUM: Magnesium: 2.1 mg/dL (ref 1.6–2.3)

## 2021-12-18 LAB — VITAMIN D 25 HYDROXY (VIT D DEFICIENCY, FRACTURES): Vit D, 25-Hydroxy: 13.7 ng/mL — ABNORMAL LOW (ref 30.0–100.0)

## 2021-12-19 ENCOUNTER — Other Ambulatory Visit: Payer: Self-pay

## 2021-12-19 ENCOUNTER — Ambulatory Visit (INDEPENDENT_AMBULATORY_CARE_PROVIDER_SITE_OTHER): Payer: Managed Care, Other (non HMO) | Admitting: Pharmacist

## 2021-12-19 DIAGNOSIS — E8881 Metabolic syndrome: Secondary | ICD-10-CM

## 2021-12-19 MED ORDER — VITAMIN D (ERGOCALCIFEROL) 1.25 MG (50000 UNIT) PO CAPS: 50000.0000 [IU] | ORAL_CAPSULE | ORAL | 0 refills | Status: DC

## 2021-12-19 NOTE — Patient Instructions (Addendum)
It was nice meeting you today  Continue your diet changes.  Concentrate on vegetables, proteins, and whole grains.  Try to avoid high fat meals  Try to increase your physical activity up to at least 30 minutes a day at least 5 days a week  We will start a new medication called Wegovy.  You will inject one pen weekly once a week for 4 weeks.    I will complete the prior authorization for you and contact you when it is complete  Please call with any questions!  Karren Cobble, PharmD, BCACP, [place], [place], [place], [place], [address] Phone: [phone], Fax: [phone]

## 2021-12-19 NOTE — Progress Notes (Signed)
Patient ID: Grant Allen                 DOB: [birth year]                    MRN: [id number]     HPI: Grant Allen is a 36 y.o. male patient referred to pharmacy clinic by Dr Servando Salina to initiate weight loss therapy with GLP1-RA. PMH is significant for obesity complicated by chronic medical conditions including HTN, vitamin D deficiency, and possible OSA. Most recent BMI 45.84.  Patient presents today for education on GLP1a.  Recently had A1c and is not diabetic or prediabetic.  However wife is prediabetic and was prescribed Ozempic.  Patient and wife have since changed their diets and are cutting out fast foods and processed foods.    Patient works Office manager at [employer].  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  Reports he has had poorly treated anxiety for many years which has led him to look for a new PCP.  Keeps him awake at night and when he does fall asleep believes he may have OSA.  Current weight management medications:   Metabolife supplements?  Previously tried meds: N/A  Current meds that may affect weight: furosemide  Baseline weight/BMI: 45.84  Insurance payor: [employer]  Diet:  -Breakfast: Salads, Protein bars, Granola bar, Fruit -Lunch: Smoothie, Chicken, Eggs -Dinner: Meat, 2 or 3 vegetables, rice -Snacks: No longer snacks -Drinks: Zero sugar soda, seltzer water   Labs: Lab Results  Component Value Date   HGBA1C 5.4 12/17/2021    Wt Readings from Last 1 Encounters:  12/17/21 (!) 357 lb 3.2 oz (162 kg)    BP Readings from Last 1 Encounters:  12/17/21 140/90   Pulse Readings from Last 1 Encounters:  12/17/21 83    No results found for: CHOL, TRIG, HDL, CHOLHDL, VLDL, LDLCALC, LDLDIRECT  Past Medical History:  Diagnosis Date   Chronic back pain    Elevated BP without diagnosis of hypertension 01/11/2020   Hypertension 10/10/2020   Morbid obesity (HCC) 10/10/2020   Palpitations 10/10/2020    Precordial pain 10/10/2020    Current Outpatient Medications on File Prior to Visit  Medication Sig Dispense Refill   furosemide (LASIX) 40 MG tablet Take 1 tablet (40 mg total) by mouth once a week. 13 tablet 3   metoprolol succinate (TOPROL XL) 25 MG 24 hr tablet Take 1 tablet (25 mg total) by mouth daily. 90 tablet 3   omeprazole (PRILOSEC) 40 MG capsule Take 40 mg by mouth daily.     potassium chloride SA (KLOR-CON M20) 20 MEQ tablet Take 1 tablet (20 mEq total) by mouth once a week. 13 tablet 3   valsartan (DIOVAN) 160 MG tablet Take 1 tablet (160 mg total) by mouth daily. 90 tablet 3   No current facility-administered medications on file prior to visit.    No Known Allergies   Assessment/Plan:  1. Weight loss - Patient is motivated to lose weight for his health and his wife's health.  Has already begin diet changes. Pharmacotherapy is appropriate to pursue as augmentation. Will start Wegovy. Confirmed patient has personal or family history of medullary thyroid carcinoma (MTC) or Multiple Endocrine Neoplasia syndrome type 2 (MEN 2).   Advised patient on common side effects including nausea, diarrhea, dyspepsia, decreased appetite, and fatigue. Counseled patient on reducing meal size and how to titrate medication to minimize side effects. Counseled patient  to call if intolerable side effects or if experiencing dehydration, abdominal pain, or dizziness. Patient will adhere to dietary modifications and will target at least 150 minutes of moderate intensity exercise weekly.   Follow up in 1 month.   Start Wegovy 0.25mg  sq q 7 days  Laural Golden, PharmD, BCACP, CDCES, CPP [address] [place], [place], [address] Phone: [phone], Fax: [phone]

## 2021-12-23 ENCOUNTER — Telehealth: Payer: Self-pay | Admitting: Cardiology

## 2021-12-23 NOTE — Telephone Encounter (Signed)
Pt c/o medication issue:  1. Name of Medication: Wegovy  2. How are you currently taking this medication (dosage and times per day)? Patient has not started yet  3. Are you having a reaction (difficulty breathing--STAT)? no  4. What is your medication issue? Patient wanted to know if the pharmacist got approval for this medication from his insurance.

## 2021-12-24 NOTE — Telephone Encounter (Signed)
Contacted patient.  PA for [place] both denied as not covered under patient's plan.  Will submit appeals

## 2022-01-01 NOTE — Telephone Encounter (Signed)
Patient calling back to follow up.

## 2022-01-02 ENCOUNTER — Encounter: Payer: Self-pay | Admitting: Pharmacist

## 2022-01-02 NOTE — Telephone Encounter (Signed)
Appeals for [employer] and Saxenda denied as medication is not on patient's formulary.  Attempting PA for Ozempic

## 2022-01-06 ENCOUNTER — Telehealth: Payer: Self-pay | Admitting: Pharmacist

## 2022-01-06 NOTE — Telephone Encounter (Signed)
° °  Pt c/o medication issue:  1. Name of Medication: Wegovy   2. How are you currently taking this medication (dosage and times per day)?   3. Are you having a reaction (difficulty breathing--STAT)?   4. What is your medication issue? Pt is following up if PA has been sent for this meds

## 2022-01-09 NOTE — Telephone Encounter (Signed)
PA for Ozempic denied due to patient not having DM.  Previously denied for [facility] and appeal for [facility] denied.  PA for Saxenda denied.  Appeal for Saxenda pending.

## 2022-04-18 ENCOUNTER — Telehealth: Payer: Self-pay | Admitting: Cardiology

## 2022-04-18 NOTE — Telephone Encounter (Signed)
Returned call to patient-patient calling in to request his metoprolol dose be increased.   He states this was working well when he started it to control his palpations but over the last month the palpations are increasing. No increase in stress of significant changes over the last month.  He is unsure of blood pressure recently but states he has been to the doctor and was told it was "good".   He is currently taking metoprolol 25 mg daily.   Advised would route to MD to review.

## 2022-04-18 NOTE — Telephone Encounter (Signed)
Pt c/o medication issue:  1. Name of Medication: metoprolol succinate (TOPROL XL) 25 MG 24 hr tablet  2. How are you currently taking this medication (dosage and times per day)? Take 1 tablet (25 mg total) by mouth daily.  3. Are you having a reaction (difficulty breathing--STAT)?   4. What is your medication issue? Patient would like to increase the dosage.  He states it doesn't seem to be work as much as it use to.

## 2022-04-23 NOTE — Telephone Encounter (Signed)
Called pt, got him set up with a MyChart visit Monday with Dr. Servando Salina.

## 2022-04-28 ENCOUNTER — Telehealth: Payer: Self-pay

## 2022-04-28 ENCOUNTER — Telehealth (INDEPENDENT_AMBULATORY_CARE_PROVIDER_SITE_OTHER): Payer: Managed Care, Other (non HMO) | Admitting: Cardiology

## 2022-04-28 VITALS — BP 140/76 | HR 76 | Ht 74.0 in | Wt 349.0 lb

## 2022-04-28 DIAGNOSIS — I10 Essential (primary) hypertension: Secondary | ICD-10-CM

## 2022-04-28 MED ORDER — METOPROLOL SUCCINATE ER 25 MG PO TB24: ORAL_TABLET | ORAL | 3 refills | Status: AC

## 2022-04-28 NOTE — Patient Instructions (Signed)
Medication Instructions:  Your physician has recommended you make the following change in your medication:  INCREASE: Metoprolol succinate 25 mg (one tablet) in the morning and 12.5 mg (half tablet) at night.  *If you need a refill on your cardiac medications before your next appointment, please call your pharmacy*   Lab Work: None If you have labs (blood work) drawn today and your tests are completely normal, you will receive your results only by: MyChart Message (if you have MyChart) OR A paper copy in the mail If you have any lab test that is abnormal or we need to change your treatment, we will call you to review the results.   Testing/Procedures: None   Follow-Up: At [facility], you and your health needs are our priority.  As part of our continuing mission to provide you with exceptional heart care, we have created designated Provider Care Teams.  These Care Teams include your primary Cardiologist (physician) and Advanced Practice Providers (APPs -  Physician Assistants and Nurse Practitioners) who all work together to provide you with the care you need, when you need it.  We recommend signing up for the patient portal called "MyChart".  Sign up information is provided on this After Visit Summary.  MyChart is used to connect with patients for Virtual Visits (Telemedicine).  Patients are able to view lab/test results, encounter notes, upcoming appointments, etc.  Non-urgent messages can be sent to your provider as well.   To learn more about what you can do with MyChart, go to [email or web address].    Your next appointment:   9 month(s)  The format for your next appointment:   In Person  Provider:   Thomasene Ripple, DO     Other Instructions   Important Information About Sugar

## 2022-04-28 NOTE — Progress Notes (Signed)
Virtual Visit via Video Note   Because of Grant Allen's co-morbid illnesses, he is at least at moderate risk for complications without adequate follow up.  This format is felt to be most appropriate for this patient at this time.  All issues noted in this document were discussed and addressed.  A limited physical exam was performed with this format.  Please refer to the patient's chart for his consent to telehealth for [facility].      Date:  04/29/2022   ID:  Grant Allen, DOB 10-24-1986, MRN [id number]  Patient Location: Home Provider Location: Office/Clinic  PCP:  Earlyne Iba, NP  Cardiologist:  Berniece Salines, DO  Electrophysiologist:  None   Evaluation Performed:  Follow-Up Visit  Chief Complaint:  I am having more palpitations  History of Present Illness:    Grant Allen is a 35 y.o. male with hypertension and obesity here today for follow-up visit.  I last saw the patient 1 December 17, 2021 at that time we referred the patient to pulmonary to address his CPAP and sleep apnea.  I also refer the patient to our weight loss clinic at that time. I replace his propanolol with metoprolol.  Since I saw the patient he has had some improvement in his symptoms but he feels the palpitations is still more.  No other complaints at this time.  The patient does not have symptoms concerning for COVID-19 infection (fever, chills, cough, or new shortness of breath).    Past Medical History:  Diagnosis Date   Chronic back pain    Elevated BP without diagnosis of hypertension 01/11/2020   Hypertension 10/10/2020   Morbid obesity ([place]) 10/10/2020   Palpitations 10/10/2020   Precordial pain 10/10/2020   Past Surgical History:  Procedure Laterality Date   no past surgical history       Current Meds  Medication Sig   metoprolol succinate (TOPROL XL) 25 MG 24 hr tablet Take 25 mg (one tablet) in the morning and 12.5 mg (half tablet) at night   valsartan (DIOVAN) 160 MG tablet Take 1  tablet (160 mg total) by mouth daily.   Vitamin D, Ergocalciferol, (DRISDOL) 1.25 MG (50000 UNIT) CAPS capsule Take 1 capsule (50,000 Units total) by mouth once a week. For 12 weeks   [DISCONTINUED] metoprolol succinate (TOPROL XL) 25 MG 24 hr tablet Take 1 tablet (25 mg total) by mouth daily.     Allergies:   Patient has no known allergies.   Social History   Tobacco Use   Smoking status: Never   Smokeless tobacco: Never  Substance Use Topics   Alcohol use: No    Comment: former   Drug use: No     Family Hx: The patient's family history includes Brain cancer in his mother; Diabetes in his mother; Hypertension in his father; Lymphoma in his brother; Thyroid disease in his father.  ROS:   Review of Systems  Constitution: Negative for decreased appetite, fever and weight gain.  HENT: Negative for congestion, ear discharge, hoarse voice and sore throat.   Eyes: Negative for discharge, redness, vision loss in right eye and visual halos.  Cardiovascular: Report palpitation.  Negative for chest pain, dyspnea on exertion, leg swelling, orthopnea. Respiratory: Negative for cough, hemoptysis, shortness of breath and snoring.   Endocrine: Negative for heat intolerance and polyphagia.  Hematologic/Lymphatic: Negative for bleeding problem. Does not bruise/bleed easily.  Skin: Negative for flushing, nail changes, rash and suspicious lesions.  Musculoskeletal: Negative for arthritis, joint  pain, muscle cramps, myalgias, neck pain and stiffness.  Gastrointestinal: Negative for abdominal pain, bowel incontinence, diarrhea and excessive appetite.  Genitourinary: Negative for decreased libido, genital sores and incomplete emptying.  Neurological: Negative for brief paralysis, focal weakness, headaches and loss of balance.  Psychiatric/Behavioral: Negative for altered mental status, depression and suicidal ideas.  Allergic/Immunologic: Negative for HIV exposure and persistent infections.      Prior CV studies:   The following studies were reviewed today:  Coronary CT scan December 14, 2020 Aorta: Normal size.  No calcifications.  No dissection.   Aortic Valve:  Trileaflet.  No calcifications.   Coronary Arteries:  Normal coronary origin.  Right dominance.   RCA is a large dominant artery that gives rise to PDA and PLVB. There is no plaque. There is a small stair-step artifact in the distal RCA.   Left main is a large artery that gives rise to LAD and LCX arteries.   LAD is a large vessel that has no plaque.   LCX is a non-dominant artery that gives rise to one large OM1 branch. There is no plaque.   Other findings:   Normal pulmonary vein drainage into the left atrium.   Normal left atrial appendage without a thrombus.   Normal size of the pulmonary artery.   IMPRESSION: 1. Coronary calcium score of 0. This was 0 percentile for age and sex matched control.   2. Normal coronary origin with right dominance.   3. No evidence of CAD.   Berniece Salines, DO       ZIO monitor June 05, 2021 Patch Wear Time:  13 days and 9 hours starting May 14, 2021. Indications: Palpitations   Patient had a minimum HR of 50 bpm, maximum HR of 172 bpm, and average HR of 83 bpm. Predominant underlying rhythm was Sinus Rhythm.   Premature atrial complexes were rare (<1.0%). Premature ventricular complexes were rare (<1.0%).    No ventricular tachycardia, no atrial fibrillation no AV block noted.   Symptoms associated with sinus rhythm and sinus tachycardia.   Conclusion: Normal/unremarkable study with no evidence of significant arrhythmia   Labs/Other Tests and Data Reviewed:    EKG:  None   Recent Labs: 12/17/2021: BUN 9; Creatinine, Ser 0.88; Hemoglobin 15.5; Magnesium 2.1; Platelets 317; Potassium 4.1; Sodium 139   Recent Lipid Panel No results found for: "CHOL", "TRIG", "HDL", "CHOLHDL", "LDLCALC", "LDLDIRECT"  Wt Readings from Last 3 Encounters:  04/28/22  (!) 349 lb (158.3 kg)  12/19/21 (!) 357 lb (161.9 kg)  12/17/21 (!) 357 lb 3.2 oz (162 kg)     Objective:    Vital Signs:  BP 140/76   Pulse 76   Ht 6\' 2"  (1.88 m)   Wt (!) 349 lb (158.3 kg)   BMI 44.81 kg/m      ASSESSMENT & PLAN:    Palpitations   He is still experiencing we will increase his metoprolol to 25 mg daily.  COVID-19 Education: The signs and symptoms of COVID-19 were discussed with the patient and how to seek care for testing (follow up with PCP or arrange E-visit).  The importance of social distancing was discussed today.  Time:   Today, I have spent 15 minutes with the patient with telehealth technology discussing the above problems.     Medication Adjustments/Labs and Tests Ordered: Current medicines are reviewed at length with the patient today.  Concerns regarding medicines are outlined above.   Tests Ordered: No orders of the defined types were  placed in this encounter.   Medication Changes: Meds ordered this encounter  Medications   metoprolol succinate (TOPROL XL) 25 MG 24 hr tablet    Sig: Take 25 mg (one tablet) in the morning and 12.5 mg (half tablet) at night    Dispense:  135 tablet    Refill:  3    Follow Up:  In Person in 6 month(s)  Signed, Berniece Salines, DO  04/29/2022 11:16 AM    [place]

## 2022-04-28 NOTE — Telephone Encounter (Signed)
Called pt to get him ready for his appointment with Dr. Servando Salina. No answer at this time. Will call back.

## 2022-06-24 ENCOUNTER — Encounter: Payer: Self-pay | Admitting: *Deleted

## 2022-06-26 ENCOUNTER — Ambulatory Visit (INDEPENDENT_AMBULATORY_CARE_PROVIDER_SITE_OTHER): Payer: Managed Care, Other (non HMO) | Admitting: Psychiatry

## 2022-06-26 VITALS — BP 130/88 | HR 67 | Ht 74.0 in | Wt 348.1 lb

## 2022-06-26 DIAGNOSIS — M5412 Radiculopathy, cervical region: Secondary | ICD-10-CM | POA: Diagnosis not present

## 2022-06-26 DIAGNOSIS — R519 Headache, unspecified: Secondary | ICD-10-CM

## 2022-06-26 NOTE — Progress Notes (Signed)
Referring:  Lars Mage, NP [address] [place],  [place] [address]  PCP: Lars Mage, NP  Neurology was asked to evaluate Grant Allen, a 36 year old male for a chief complaint of headaches.  Our recommendations of care will be communicated by shared medical record.    CC:  headaches  History provided from self  HPI:  Medical co-morbidities: untreated OSA, HTN, GERD, anxiety  The patient presents for evaluation of headaches and dizziness which began 3-4 years ago. Feels pressure on the left side of his neck and back of the head. This is worse when he is laughing or with valsalva. Pressure lasts until the action stops. Occasionally has photophobia and phonophobia as well. Sometimes feels like has a film over his eyes. Hearing is also intermittently decreased on the left side. Headaches have been worsening and are now occurring daily. He does have a history of migraine headaches which are rare to him. Current headaches feel different from his migraines.  He also has sharp pinching pain on the left side of his neck. Feels his neck has been popping every time he turns it. Has paresthesias and grip strength weakness in both hands. Notes he does have a history of carpal tunnel and recently had surgery on his left hand. Has previously done neck PT and chiropractics which he did not find helpful. States he recently had a neck X-ray which was normal.  Also intermittently feels "dazed" and lightheaded. This is sometimes associated with nausea. This occurs ~twice per week. Sometimes feels like he will stumble and almost lose his balance. Had one episode of vertigo when he was 36 years old, has not had any room spinning since then.  Headache History: Onset: 3-4 years ago Triggers: laughing, screens Aura: film over eyes Location: occiput Quality/Description: pressure Associated Symptoms:  Photophobia: yes  Phonophobia: yes  Nausea: no Vomiting: no Worse with activity?: no Duration of  headaches: minutes  Headache days per month: 30 Headache free days per month: 0  Current Treatment: Abortive none  Preventative none  Prior Therapies                                 Robaxin Tylenol Lexapro Metoprolol   LABS: CBC    Component Value Date/Time   WBC 7.2 12/17/2021 1010   WBC 9.5 08/10/2015 0414   RBC 5.10 12/17/2021 1010   RBC 5.12 08/10/2015 0414   HGB 15.5 12/17/2021 1010   HCT 44.7 12/17/2021 1010   PLT 317 12/17/2021 1010   MCV 88 12/17/2021 1010   MCV 89 12/24/2014 2339   MCH 30.4 12/17/2021 1010   MCH 31.4 08/10/2015 0414   MCHC 34.7 12/17/2021 1010   MCHC 35.5 08/10/2015 0414   RDW 12.5 12/17/2021 1010   RDW 13.5 12/24/2014 2339   LYMPHSABS 1.5 12/17/2021 1010   MONOABS 0.7 08/15/2014 0510   EOSABS 0.1 12/17/2021 1010   BASOSABS 0.1 12/17/2021 1010      Latest Ref Rng & Units 12/17/2021   10:10 AM 08/10/2015    4:22 AM 12/24/2014   11:39 PM  CMP  Glucose 70 - 99 mg/dL 98  191  478   BUN 6 - 20 mg/dL 9  9  8    Creatinine 0.76 - 1.27 mg/dL  2.95  6.21   Sodium 134 - 144 mmol/L 139  140  140   Potassium 3.5 - 5.2 mmol/L 4.1  3.6  3.0   Chloride 96 - 106 mmol/L 101  101  104   CO2 20 - 29 mmol/L 26   29   Calcium 8.7 - 10.2 mg/dL 9.3   8.6      IMAGING:  none   Current Outpatient Medications on File Prior to Visit  Medication Sig Dispense Refill   furosemide (LASIX) 40 MG tablet Take 40 mg by mouth.     metoprolol succinate (TOPROL XL) 25 MG 24 hr tablet Take 25 mg (one tablet) in the morning and 12.5 mg (half tablet) at night 135 tablet 3   omeprazole (PRILOSEC) 40 MG capsule Take 40 mg by mouth daily.     potassium chloride SA (KLOR-CON M) 20 MEQ tablet Take 20 mEq by mouth 2 (two) times daily.     valsartan (DIOVAN) 160 MG tablet Take 1 tablet (160 mg total) by mouth daily. 90 tablet 3   furosemide (LASIX) 40 MG tablet Take 1 tablet (40 mg total) by mouth once a week. 13 tablet 3   potassium chloride SA (KLOR-CON M20) 20 MEQ  tablet Take 1 tablet (20 mEq total) by mouth once a week. 13 tablet 3   Vitamin D, Ergocalciferol, (DRISDOL) 1.25 MG (50000 UNIT) CAPS capsule Take 1 capsule (50,000 Units total) by mouth once a week. For 12 weeks 12 capsule 0   No current facility-administered medications on file prior to visit.     Allergies: No Known Allergies  Family History: Migraine or other headaches in the family:  none Aneurysms in a first degree relative:  none Brain tumors in the family:  mother had a brain tumor Other neurological illness in the family:   father had supranulear palsy  Past Medical History: Past Medical History:  Diagnosis Date   Carpal tunnel syndrome    Chronic back pain    Elevated BP without diagnosis of hypertension 01/11/2020   GERD (gastroesophageal reflux disease)    Hypertension 10/10/2020   Morbid obesity ([facility]) 10/10/2020   Palpitations 10/10/2020   Precordial pain 10/10/2020   Sleep apnea    Vitamin D deficiency     Past Surgical History Past Surgical History:  Procedure Laterality Date   no past surgical history      Social History: Social History   Tobacco Use   Smoking status: Never   Smokeless tobacco: Never  Substance Use Topics   Alcohol use: No    Comment: former   Drug use: No    Comment: 06/24/22 occas CBD    ROS: Negative for fevers, chills. Positive for headaches, blurred vision, dizziness. All other systems reviewed and negative unless stated otherwise in HPI.   Physical Exam:   Vital Signs: Ht 6\' 2"  (1.88 m)   Wt (!) 348 lb 2 oz (157.9 kg)   BMI 44.70 kg/m  GENERAL: well appearing,in no acute distress,alert SKIN:  Color, texture, turgor normal. No rashes or lesions HEAD:  Normocephalic/atraumatic. CV:  RRR RESP: Normal respiratory effort MSK: +tenderness to palpation over bilateral occiput, neck, and shoulders. Mildly decreased range of motion turning head to the left  NEUROLOGICAL: Mental Status: Alert, oriented to person, place  and time,Follows commands Cranial Nerves: PERRL, visual fields intact to confrontation, extraocular movements intact, decreased sensation over right V2, no facial droop or ptosis, hearing grossly intact, no dysarthria, palate elevate symmetrically, tongue protrudes midline, shoulder shrug intact and symmetric Motor: decreased grip strength bilaterally, otherwise muscle strength 5/5 both upper and lower extremities Reflexes: brisk left bicep, otherwise  2+ throughout Sensation: intact to light touch all 4 extremities Coordination: Finger-to- nose-finger intact bilaterally,Heel-to-shin intact bilaterally Gait: normal-based   IMPRESSION: 36 year old male with a history of untreated OSA, HTN, GERD, anxiety who presents for evaluation of worsening headaches and neck pain. Will order MRI brain as he is now having daily headaches with valsalva, and exam reveals decreased sensation over right V2. MRI C-spine ordered as he reports paresthesias and grip strength weakness bilaterally. Suspect his untreated OSA may be contributing to his headaches as well. He is currently working on setting up a PSG.  PLAN: -MRI brain -MRI C-spine  I spent a total of 29 minutes chart reviewing and counseling the patient. Headache education was done. Written educational materials and patient instructions outlining all of the above were given.  Follow-up: 3 months   Ocie Doyne, MD 06/26/2022   8:39 AM

## 2022-06-26 NOTE — Patient Instructions (Signed)
MRI brain and C-spine

## 2022-06-29 ENCOUNTER — Telehealth: Payer: Self-pay | Admitting: Psychiatry

## 2022-06-29 NOTE — Telephone Encounter (Signed)
[employer] sent to GI they obtain auth  

## 2022-07-11 ENCOUNTER — Ambulatory Visit
Admission: RE | Admit: 2022-07-11 | Discharge: 2022-07-11 | Disposition: A | Discharge status: Home / Self Care | Payer: Managed Care, Other (non HMO) | Source: Ambulatory Visit | Attending: Psychiatry

## 2022-07-11 DIAGNOSIS — R519 Headache, unspecified: Secondary | ICD-10-CM

## 2022-07-11 DIAGNOSIS — M5412 Radiculopathy, cervical region: Secondary | ICD-10-CM

## 2022-07-11 MED ORDER — GADOBENATE DIMEGLUMINE 529 MG/ML IV SOLN
20.0000 mL | Freq: Once | INTRAVENOUS | Status: AC | PRN
  Administered 2022-07-11: 20 mL via INTRAVENOUS

## 2022-07-14 ENCOUNTER — Other Ambulatory Visit: Payer: Self-pay | Admitting: Cardiology

## 2022-07-14 NOTE — Telephone Encounter (Signed)
Rx refill sent to pharmacy. 

## 2022-10-07 ENCOUNTER — Ambulatory Visit: Payer: Managed Care, Other (non HMO) | Admitting: Adult Health
